# Patient Record
Sex: Male | Born: 1980 | Race: Black or African American | Hispanic: No | Marital: Married | Smoking: Never smoker
Health system: Southern US, Community
[De-identification: ages and names within clinical notes are randomized; demographics above are authoritative.]

---
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
## 2020-01-01 ENCOUNTER — Encounter (HOSPITAL_COMMUNITY): Payer: Self-pay | Admitting: *Deleted

## 2020-01-01 ENCOUNTER — Emergency Department (HOSPITAL_COMMUNITY)
Admission: EM | Admit: 2020-01-01 | Discharge: 2020-01-01 | Disposition: A | Discharge status: Home / Self Care | Payer: PRIVATE HEALTH INSURANCE | Attending: Emergency Medicine | Admitting: Emergency Medicine

## 2020-01-01 ENCOUNTER — Other Ambulatory Visit: Payer: Self-pay

## 2020-01-01 ENCOUNTER — Emergency Department (HOSPITAL_COMMUNITY): Payer: PRIVATE HEALTH INSURANCE

## 2020-01-01 DIAGNOSIS — Y939 Activity, unspecified: Secondary | ICD-10-CM | POA: Insufficient documentation

## 2020-01-01 DIAGNOSIS — Y929 Unspecified place or not applicable: Secondary | ICD-10-CM | POA: Diagnosis not present

## 2020-01-01 DIAGNOSIS — S29011A Strain of muscle and tendon of front wall of thorax, initial encounter: Secondary | ICD-10-CM | POA: Diagnosis not present

## 2020-01-01 DIAGNOSIS — Y990 Civilian activity done for income or pay: Secondary | ICD-10-CM | POA: Insufficient documentation

## 2020-01-01 DIAGNOSIS — X501XXA Overexertion from prolonged static or awkward postures, initial encounter: Secondary | ICD-10-CM | POA: Insufficient documentation

## 2020-01-01 DIAGNOSIS — S299XXA Unspecified injury of thorax, initial encounter: Secondary | ICD-10-CM | POA: Diagnosis present

## 2020-01-01 DIAGNOSIS — Y999 Unspecified external cause status: Secondary | ICD-10-CM | POA: Insufficient documentation

## 2020-01-01 DIAGNOSIS — R030 Elevated blood-pressure reading, without diagnosis of hypertension: Secondary | ICD-10-CM | POA: Diagnosis not present

## 2020-01-01 NOTE — ED Notes (Signed)
Patient transported to X-ray 

## 2020-01-01 NOTE — ED Provider Notes (Signed)
[facility]-EMERGENCY DEPT Provider Note   CSN: [id number] Arrival date & time: 01/01/20  0017   History Chief Complaint  Patient presents with  . Rib Pain    Travis Jacobson is a 39 y.o. male.  The history is provided by the patient.  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  Pain does get worse with certain movements, but not consistently.  He rates pain at 4/10.  He denies any difficulty breathing.  Pain is not worse with a deep breath.  He denies any other injury.  History reviewed. No pertinent past medical history.  There are no problems to display for this patient.   History reviewed. No pertinent surgical history.     No family history on file.  Social History   Tobacco Use  . Smoking status: Not on file  Substance Use Topics  . Alcohol use: Not on file  . Drug use: Not on file    Home Medications Prior to Admission medications   Medication Sig Start Date End Date Taking? Authorizing Provider  naproxen sodium (ALEVE) 220 MG tablet Take 220 mg by mouth 2 (two) times daily as needed (pain).   Yes [provider]    Allergies    Patient has no known allergies.  Review of Systems   Review of Systems  All other systems reviewed and are negative.   Physical Exam Updated Vital Signs BP (!) 174/99 (BP Location: Left Arm)   Pulse 89   Temp 98.4 F (36.9 C) (Oral)   Resp 18   Ht 5\' 9"  (1.753 m)   Wt 135.2 kg   SpO2 98%   BMI 44.01 kg/m   Physical Exam Vitals and nursing note reviewed.   39 year old male, resting comfortably and in no acute distress. Vital signs are significant for elevated blood pressure. Oxygen saturation is 98%, which is normal. Head is normocephalic and atraumatic. PERRLA, EOMI. Oropharynx is clear. Neck is nontender and supple without adenopathy or JVD. Back is moderately tender in the right  mid and lower rib cage.  There is no crepitus.  There is no CVA tenderness. Lungs are clear without rales, wheezes, or rhonchi. Chest is nontender. Heart has regular rate and rhythm without murmur. Abdomen is soft, flat, nontender without masses or hepatosplenomegaly and peristalsis is normoactive. Extremities have no cyanosis or edema, full range of motion is present. Skin is warm and dry without rash. Neurologic: Mental status is normal, cranial nerves are intact, there are no motor or sensory deficits.  ED Results / Procedures / Treatments    Radiology DG Ribs Unilateral W/Chest Right  Result Date: 01/01/2020 CLINICAL DATA:  Right rib pain EXAM: RIGHT RIBS AND CHEST - 3+ VIEW COMPARISON:  None. FINDINGS: No fracture or other bone lesions are seen involving the ribs. There is no evidence of pneumothorax or pleural effusion. Both lungs are clear. Heart size and mediastinal contours are within normal limits. IMPRESSION: Negative. Electronically Signed   By: 01/03/2020 MD   On: 01/01/2020 00:59    Procedures Procedures  Medications Ordered in ED Medications - No data to display  ED Course  I have reviewed the triage vital signs and the nursing notes.  Pertinent imaging results that were available during my care of the patient were reviewed by me and considered in my medical decision making (see  chart for details).  MDM Rules/Calculators/A&P Probable muscle strain right posterior rib cage.  Elevated blood pressure.  X-rays show no evidence of fracture.  Will recheck blood pressure.  Patient will need to have this followed as an outpatient.  Advised on applying ice, taking over-the-counter NSAIDs and acetaminophen as needed for pain.  Final Clinical Impression(s) / ED Diagnoses Final diagnoses:  Chest wall muscle strain, initial encounter  Elevated blood pressure reading without diagnosis of hypertension    Rx / DC Orders ED Discharge Orders    None       Dione Booze,  MD 01/01/20 [phone]

## 2020-01-01 NOTE — ED Triage Notes (Signed)
Pt states he was at work, reached up over his head with his right and he heard/felt a pop in his right side. "shooting pain". Pain to deep inspiration. Pain over the ribs to palpation. HTN in triage, denies no hx of the same.

## 2020-01-01 NOTE — Discharge Instructions (Addendum)
Apply ice for 20-30 minutes at a time, 3-4 times a day.  Take naproxen 1-2 tablets at a time, twice a day.  If you need additional pain relief, you may take acetaminophen.  Your blood pressure was elevated today.  Please have it rechecked several times over the next 1-2 weeks.  If it stays elevated, you will need to be on medication to control it.  Inadequately treated high blood pressure can lead to heart attacks, strokes, kidney failure.

## 2021-02-11 IMAGING — CR DG RIBS W/ CHEST 3+V*R*
4 series · 4 of 4 positions shown · non-contrast
Comparison: None.

CLINICAL DATA: Right rib pain

EXAM:
RIGHT RIBS AND CHEST - 3+ VIEW

[w chest pa]
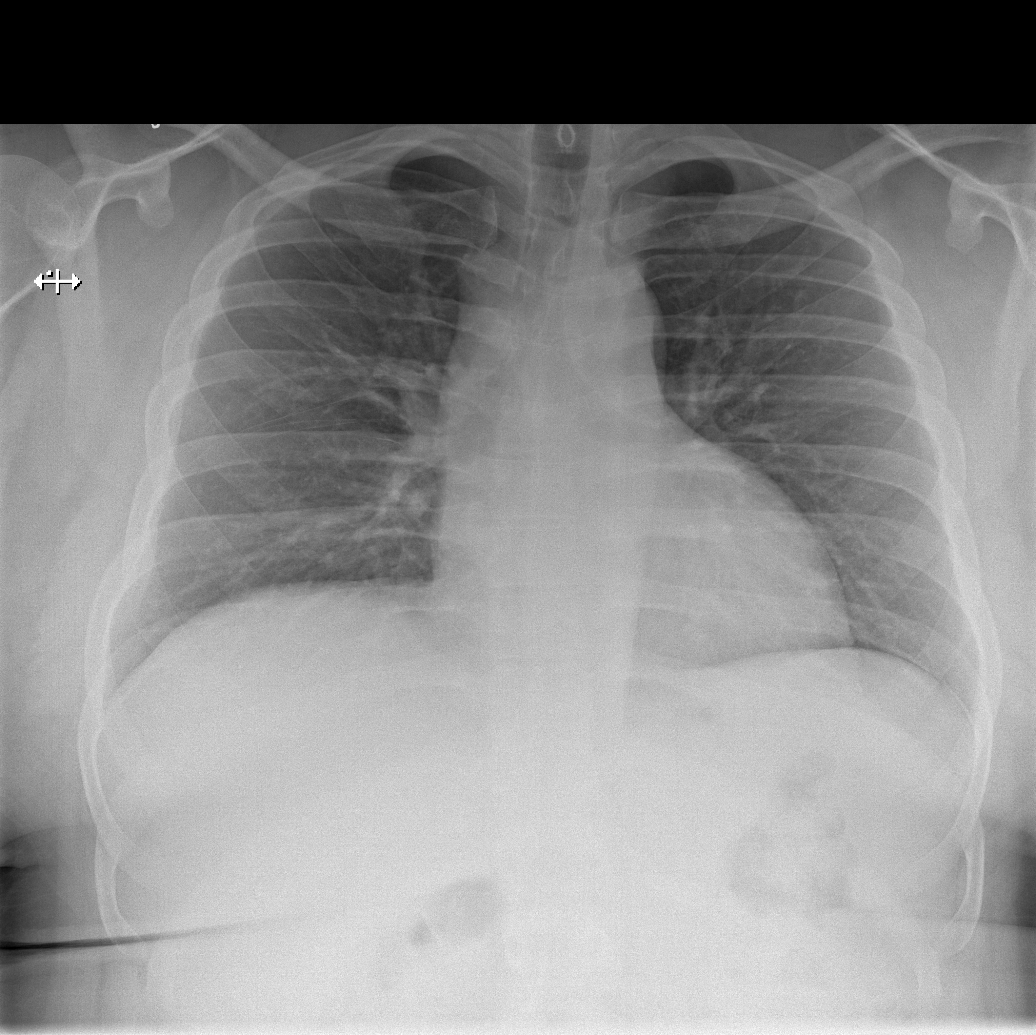

[w ribs obl right (1 of 3)]
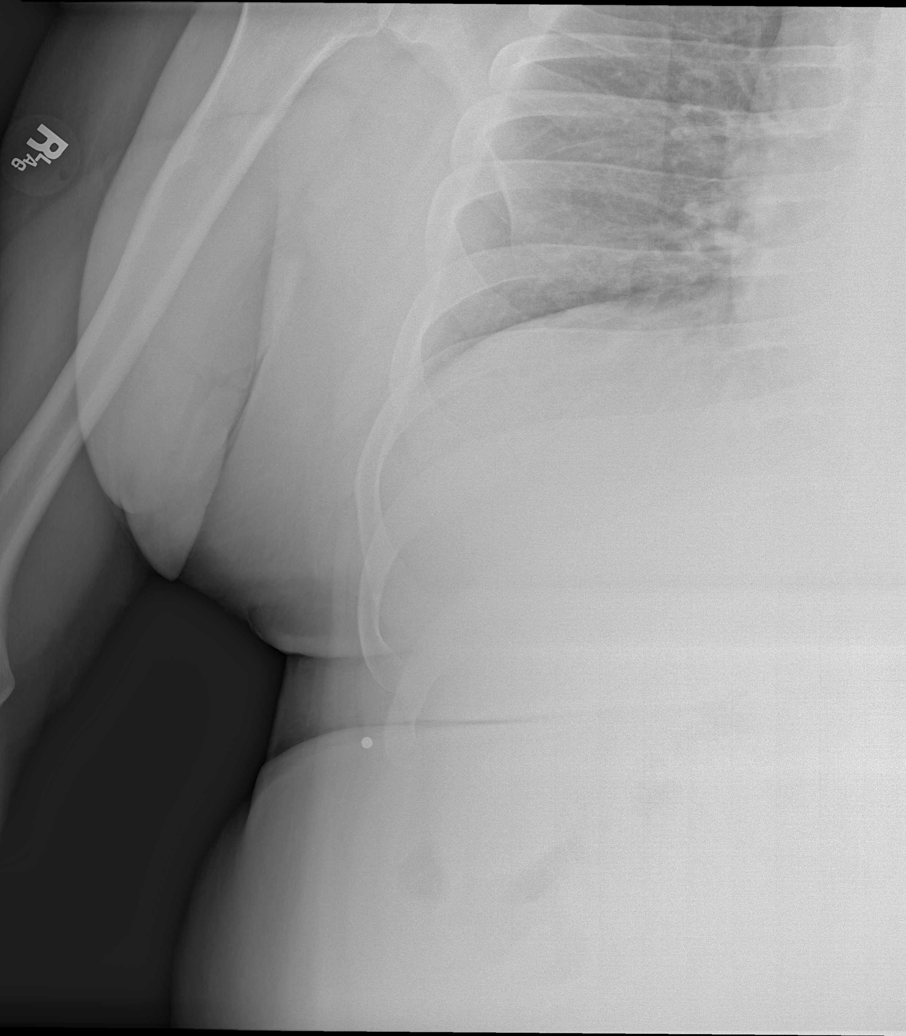

[w ribs obl right (2 of 3)]
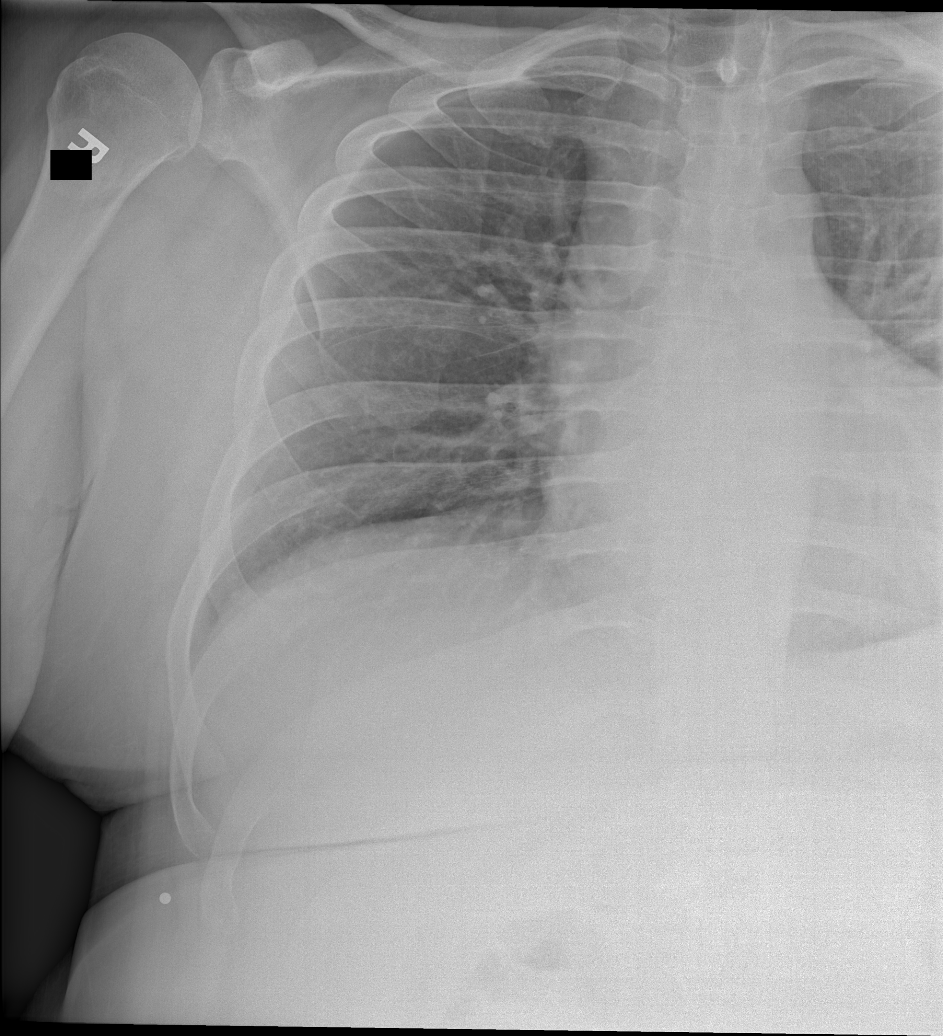

[w ribs obl right (3 of 3)]
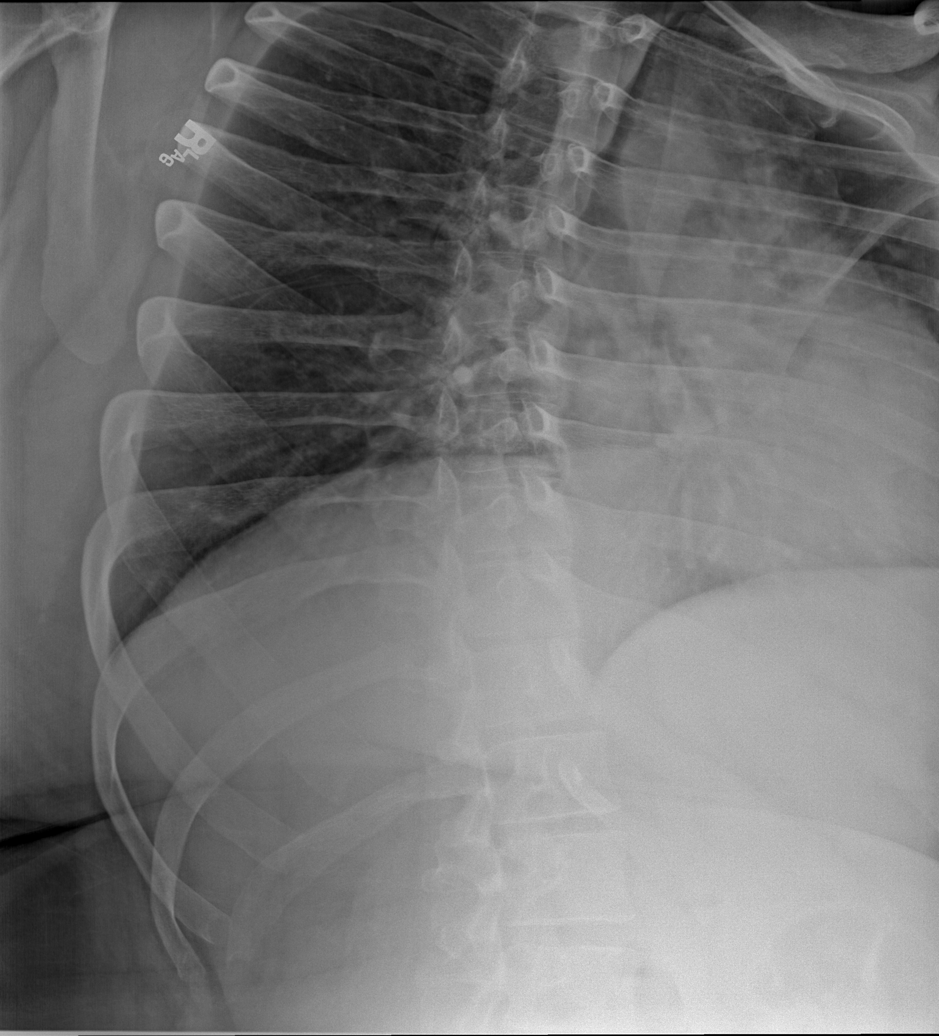

[4 of 4 positions shown; findings below may reference images not displayed]

FINDINGS: No fracture or other bone lesions are seen involving the ribs. There
is no evidence of pneumothorax or pleural effusion. Both lungs are
clear. Heart size and mediastinal contours are within normal limits.
IMPRESSION: Negative.

## 2022-10-13 ENCOUNTER — Other Ambulatory Visit: Payer: Self-pay

## 2022-10-13 ENCOUNTER — Inpatient Hospital Stay (HOSPITAL_COMMUNITY)
Admission: EM | Admit: 2022-10-13 | Discharge: 2022-10-16 | DRG: 871 | Disposition: A | Discharge status: Home / Self Care | Payer: 59 | Attending: Family Medicine | Admitting: Family Medicine

## 2022-10-13 ENCOUNTER — Emergency Department (HOSPITAL_COMMUNITY): Payer: 59

## 2022-10-13 ENCOUNTER — Ambulatory Visit
Admission: EM | Admit: 2022-10-13 | Discharge: 2022-10-13 | Disposition: A | Discharge status: Home / Self Care | Payer: 59

## 2022-10-13 DIAGNOSIS — J189 Pneumonia, unspecified organism: Secondary | ICD-10-CM | POA: Diagnosis present

## 2022-10-13 DIAGNOSIS — A419 Sepsis, unspecified organism: Secondary | ICD-10-CM | POA: Diagnosis present

## 2022-10-13 DIAGNOSIS — Z6841 Body Mass Index (BMI) 40.0 and over, adult: Secondary | ICD-10-CM | POA: Diagnosis not present

## 2022-10-13 DIAGNOSIS — E871 Hypo-osmolality and hyponatremia: Secondary | ICD-10-CM | POA: Diagnosis present

## 2022-10-13 DIAGNOSIS — N183 Chronic kidney disease, stage 3 unspecified: Secondary | ICD-10-CM | POA: Diagnosis present

## 2022-10-13 DIAGNOSIS — Z1152 Encounter for screening for COVID-19: Secondary | ICD-10-CM

## 2022-10-13 DIAGNOSIS — E876 Hypokalemia: Secondary | ICD-10-CM | POA: Diagnosis present

## 2022-10-13 DIAGNOSIS — I2489 Other forms of acute ischemic heart disease: Secondary | ICD-10-CM | POA: Diagnosis present

## 2022-10-13 DIAGNOSIS — J9601 Acute respiratory failure with hypoxia: Secondary | ICD-10-CM | POA: Diagnosis present

## 2022-10-13 DIAGNOSIS — I5032 Chronic diastolic (congestive) heart failure: Secondary | ICD-10-CM | POA: Diagnosis present

## 2022-10-13 DIAGNOSIS — R0609 Other forms of dyspnea: Secondary | ICD-10-CM | POA: Diagnosis not present

## 2022-10-13 DIAGNOSIS — N179 Acute kidney failure, unspecified: Secondary | ICD-10-CM | POA: Diagnosis present

## 2022-10-13 DIAGNOSIS — J9600 Acute respiratory failure, unspecified whether with hypoxia or hypercapnia: Secondary | ICD-10-CM | POA: Diagnosis present

## 2022-10-13 DIAGNOSIS — R0602 Shortness of breath: Secondary | ICD-10-CM | POA: Diagnosis not present

## 2022-10-13 HISTORY — DX: Sepsis, unspecified organism: J18.9

## 2022-10-13 HISTORY — DX: Pneumonia, unspecified organism: A41.9

## 2022-10-13 HISTORY — DX: Acute respiratory failure, unspecified whether with hypoxia or hypercapnia: J96.00

## 2022-10-13 LAB — CBC WITH DIFFERENTIAL/PLATELET
Abs Immature Granulocytes: 0.99 10*3/uL — ABNORMAL HIGH (ref 0.00–0.07)
Basophils Absolute: 0.1 10*3/uL (ref 0.0–0.1)
Basophils Relative: 0 %
Eosinophils Absolute: 0.1 10*3/uL (ref 0.0–0.5)
Eosinophils Relative: 0 %
HCT: 40 % (ref 39.0–52.0)
Hemoglobin: 12.5 g/dL — ABNORMAL LOW (ref 13.0–17.0)
Immature Granulocytes: 4 %
Lymphocytes Relative: 6 %
Lymphs Abs: 1.6 10*3/uL (ref 0.7–4.0)
MCH: 24.5 pg — ABNORMAL LOW (ref 26.0–34.0)
MCHC: 31.3 g/dL (ref 30.0–36.0)
MCV: 78.3 fL — ABNORMAL LOW (ref 80.0–100.0)
Monocytes Absolute: 1.8 10*3/uL — ABNORMAL HIGH (ref 0.1–1.0)
Monocytes Relative: 7 %
Neutro Abs: 21.8 10*3/uL — ABNORMAL HIGH (ref 1.7–7.7)
Neutrophils Relative %: 83 %
Platelets: 305 10*3/uL (ref 150–400)
RBC: 5.11 MIL/uL (ref 4.22–5.81)
RDW: 13.3 % (ref 11.5–15.5)
WBC: 26.3 10*3/uL — ABNORMAL HIGH (ref 4.0–10.5)
nRBC: 0 % (ref 0.0–0.2)

## 2022-10-13 LAB — COMPREHENSIVE METABOLIC PANEL
ALT: 15 U/L (ref 0–44)
AST: 15 U/L (ref 15–41)
Albumin: 3.2 g/dL — ABNORMAL LOW (ref 3.5–5.0)
Alkaline Phosphatase: 45 U/L (ref 38–126)
Anion gap: 12 (ref 5–15)
BUN: 17 mg/dL (ref 6–20)
CO2: 27 mmol/L (ref 22–32)
Calcium: 8.1 mg/dL — ABNORMAL LOW (ref 8.9–10.3)
Chloride: 91 mmol/L — ABNORMAL LOW (ref 98–111)
Creatinine, Ser: 1.63 mg/dL — ABNORMAL HIGH (ref 0.61–1.24)
GFR, Estimated: 54 mL/min — ABNORMAL LOW (ref >60)
Glucose, Bld: 120 mg/dL — ABNORMAL HIGH (ref 70–99)
Potassium: 3 mmol/L — ABNORMAL LOW (ref 3.5–5.1)
Sodium: 130 mmol/L — ABNORMAL LOW (ref 135–145)
Total Bilirubin: 0.9 mg/dL (ref 0.3–1.2)
Total Protein: 7.7 g/dL (ref 6.5–8.1)

## 2022-10-13 LAB — TROPONIN I (HIGH SENSITIVITY)
Troponin I (High Sensitivity): 100 ng/L (ref <18)
Troponin I (High Sensitivity): 85 ng/L — ABNORMAL HIGH (ref <18)

## 2022-10-13 LAB — RESP PANEL BY RT-PCR (RSV, FLU A&B, COVID)  RVPGX2
Influenza A by PCR: NEGATIVE
Influenza B by PCR: NEGATIVE
Resp Syncytial Virus by PCR: NEGATIVE
SARS Coronavirus 2 by RT PCR: NEGATIVE

## 2022-10-13 LAB — LACTIC ACID, PLASMA
Lactic Acid, Venous: 1.2 mmol/L (ref 0.5–1.9)
Lactic Acid, Venous: 1.7 mmol/L (ref 0.5–1.9)

## 2022-10-13 LAB — BRAIN NATRIURETIC PEPTIDE: B Natriuretic Peptide: 271.5 pg/mL — ABNORMAL HIGH (ref 0.0–100.0)

## 2022-10-13 MED ORDER — IOHEXOL 350 MG/ML SOLN
75.0000 mL | Freq: Once | INTRAVENOUS | Status: AC | PRN
  Administered 2022-10-13: 75 mL via INTRAVENOUS

## 2022-10-13 MED ORDER — SODIUM CHLORIDE 0.9 % IV SOLN
1.0000 g | Freq: Once | INTRAVENOUS | Status: AC
  Administered 2022-10-13: 1 g via INTRAVENOUS
  Filled 2022-10-13: qty 10

## 2022-10-13 MED ORDER — POTASSIUM CHLORIDE CRYS ER 20 MEQ PO TBCR
40.0000 meq | EXTENDED_RELEASE_TABLET | Freq: Once | ORAL | Status: AC
  Administered 2022-10-13: 40 meq via ORAL
  Filled 2022-10-13: qty 2

## 2022-10-13 MED ORDER — SODIUM CHLORIDE 0.9 % IV SOLN: INTRAVENOUS | Status: DC

## 2022-10-13 MED ORDER — SODIUM CHLORIDE 0.9 % IV SOLN
500.0000 mg | Freq: Once | INTRAVENOUS | Status: AC
  Administered 2022-10-13: 500 mg via INTRAVENOUS
  Filled 2022-10-13: qty 5

## 2022-10-13 MED ORDER — GUAIFENESIN ER 600 MG PO TB12
600.0000 mg | ORAL_TABLET | Freq: Two times a day (BID) | ORAL | Status: DC
  Administered 2022-10-14 – 2022-10-15 (×3): 600 mg via ORAL
  Filled 2022-10-13 (×3): qty 1

## 2022-10-13 MED ORDER — LACTATED RINGERS IV BOLUS
1000.0000 mL | Freq: Once | INTRAVENOUS | Status: AC
  Administered 2022-10-13: 1000 mL via INTRAVENOUS

## 2022-10-13 MED ORDER — IPRATROPIUM-ALBUTEROL 0.5-2.5 (3) MG/3ML IN SOLN
3.0000 mL | Freq: Once | RESPIRATORY_TRACT | Status: AC
  Administered 2022-10-13: 3 mL via RESPIRATORY_TRACT
  Filled 2022-10-13: qty 3

## 2022-10-13 NOTE — ED Notes (Signed)
Pt do not want to go by EMS to the ED. Pt reports his family will drive him to the ED.

## 2022-10-13 NOTE — ED Provider Notes (Addendum)
[facility] EMERGENCY DEPARTMENT AT [facility] Provider Note   CSN: [id number] Arrival date & time: 10/13/22  1625     History  Chief Complaint  Patient presents with   Shortness of Breath   Otalgia    Travis Jacobson is a 42 y.o. male.  HPI Patient reports he has been sick for about a week.  He has had a cough.  He reports he has had a fever at one point up to 101.  No lower extremity swelling or pain.  No sick contacts at home.  Family members have tested negative for COVID.  Patient's wife reports she was diagnosed with a bronchitis and pneumonia as well.  Symptoms have been worsening and he has gotten increasingly short of breath especially with exertion.    Home Medications Prior to Admission medications   Medication Sig Start Date End Date Taking? Authorizing Provider  naproxen sodium (ALEVE) 220 MG tablet Take 220 mg by mouth 2 (two) times daily as needed (pain).    [provider]      Allergies    Patient has no known allergies.    Review of Systems   Review of Systems  Physical Exam Updated Vital Signs BP 135/86   Pulse (!) 103   Temp 97.9 F (36.6 C) (Oral)   Resp (!) 29   Ht 5\' 9"  (1.753 m)   Wt (!) 137 kg   SpO2 92%   BMI 44.60 kg/m  Physical Exam Constitutional:      Comments: Mild increased work of breathing at rest.  Mental status clear.  HENT:     Mouth/Throat:     Pharynx: Oropharynx is clear.  Eyes:     Extraocular Movements: Extraocular movements intact.  Cardiovascular:     Comments: Mild tachycardia. Pulmonary:     Comments: Mild increased work of breathing.  Crackles left lung base. Chest:     Chest wall: Tenderness present.  Abdominal:     Palpations: Abdomen is soft.  Musculoskeletal:        General: No swelling. Normal range of motion.     Right lower leg: No edema.     Left lower leg: No edema.  Skin:    General: Skin is warm and dry.  Neurological:     General: No focal deficit present.     Mental  Status: He is oriented to person, place, and time.     Motor: No weakness.     Coordination: Coordination normal.  Psychiatric:        Mood and Affect: Mood normal.     ED Results / Procedures / Treatments   Labs (all labs ordered are listed, but only abnormal results are displayed) Labs Reviewed  COMPREHENSIVE METABOLIC PANEL - Abnormal; Notable for the following components:      Result Value   Sodium 130 (*)    Potassium 3.0 (*)    Chloride 91 (*)    Glucose, Bld 120 (*)    Creatinine, Ser 1.63 (*)    Calcium 8.1 (*)    Albumin 3.2 (*)    GFR, Estimated 54 (*)    All other components within normal limits  BRAIN NATRIURETIC PEPTIDE - Abnormal; Notable for the following components:   B Natriuretic Peptide 271.5 (*)    All other components within normal limits  CBC WITH DIFFERENTIAL/PLATELET - Abnormal; Notable for the following components:   WBC 26.3 (*)    Hemoglobin 12.5 (*)    MCV 78.3 (*)  MCH 24.5 (*)    Neutro Abs 21.8 (*)    Monocytes Absolute 1.8 (*)    Abs Immature Granulocytes 0.99 (*)    All other components within normal limits  TROPONIN I (HIGH SENSITIVITY) - Abnormal; Notable for the following components:   Troponin I (High Sensitivity) 100 (*)    All other components within normal limits  RESP PANEL BY RT-PCR (RSV, FLU A&B, COVID)  RVPGX2  CULTURE, BLOOD (ROUTINE X 2)  CULTURE, BLOOD (ROUTINE X 2)  LACTIC ACID, PLASMA  LACTIC ACID, PLASMA  TROPONIN I (HIGH SENSITIVITY)    EKG EKG Interpretation  Date/Time:  Friday October 13 2022 16:35:17 EDT Ventricular Rate:  103 PR Interval:  132 QRS Duration: 87 QT Interval:  359 QTC Calculation: 470 R Axis:   43 Text Interpretation: Sinus tachycardia Probable LVH with secondary repol abnrm Anterior ST elevation, probably due to LVH agree, no old comparison Confirmed by Arby Barrette [id number]) on 10/13/2022 5:28:59 PM  Radiology CT Angio Chest PE W/Cm &/Or Wo Cm  Result Date: 10/13/2022 CLINICAL DATA:   Pulmonary embolism (PE) suspected, high prob Fever, shortness of breath, left-sided pain. EXAM: CT ANGIOGRAPHY CHEST WITH CONTRAST TECHNIQUE: Multidetector CT imaging of the chest was performed using the standard protocol during bolus administration of intravenous contrast. Multiplanar CT image reconstructions and MIPs were obtained to evaluate the vascular anatomy. RADIATION DOSE REDUCTION: This exam was performed according to the departmental dose-optimization program which includes automated exposure control, adjustment of the mA and/or kV according to patient size and/or use of iterative reconstruction technique. CONTRAST:  75mL OMNIPAQUE IOHEXOL 350 MG/ML SOLN COMPARISON:  Chest radiograph earlier today FINDINGS: Cardiovascular: There are no filling defects within the pulmonary arteries to suggest pulmonary embolus. Upper normal main pulmonary artery at 3.2 cm. The heart is mildly enlarged. No pericardial effusion. The thoracic aorta is normal in caliber without acute findings. Mediastinum/Nodes: Diffuse mediastinal and bilateral hilar adenopathy. Representative left lower paratracheal node measures 14 mm short axis, series 5, image 47. Right hilar lymph node measures 17 mm short axis, series 5, image 57. patulous esophagus without wall thickening. Lungs/Pleura: Extensive multifocal airspace disease. Confluent consolidation involving the lateral left upper lobe and lingula, periphery of the right middle lobe and both lower lobes. Additional areas of nodular airspace disease throughout the upper lobes. There are small bilateral pleural effusions. Upper Abdomen: Hepatic steatosis. No acute upper abdominal findings. Musculoskeletal: There are no acute or suspicious osseous abnormalities. Review of the MIP images confirms the above findings. IMPRESSION: 1. No pulmonary embolus. 2. Extensive multifocal airspace disease, consistent with bilateral pneumonia. Small bilateral pleural effusions. 3. Mediastinal and  bilateral hilar adenopathy is likely reactive, however consider follow-up CT after resolution of symptoms to resolution. 4. Mild cardiomegaly. Upper normal main pulmonary artery can be seen with pulmonary arterial hypertension. 5. Hepatic steatosis. Electronically Signed   By: Narda Rutherford M.D.   On: 10/13/2022 19:39   DG Chest 2 View  Result Date: 10/13/2022 CLINICAL DATA:  Shortness of breath and productive cough. EXAM: CHEST - 2 VIEW COMPARISON:  January 01, 2020 FINDINGS: The heart size and mediastinal contours are within normal limits. Low lung volumes are noted. Mild infiltrate is seen within the right lung base. Mild left basilar atelectasis is also suspected. There is no evidence of a pleural effusion or pneumothorax. The visualized skeletal structures are unremarkable. IMPRESSION: Low lung volumes with mild right basilar infiltrate and mild left basilar atelectasis. Electronically Signed   By: Aram Candela  M.D.   On: 10/13/2022 18:30    Procedures Procedures    Medications Ordered in ED Medications  cefTRIAXone (ROCEPHIN) 1 g in sodium chloride 0.9 % 100 mL IVPB (1 g Intravenous New Bag/Given 10/13/22 2031)  azithromycin (ZITHROMAX) 500 mg in sodium chloride 0.9 % 250 mL IVPB (has no administration in time range)  ipratropium-albuterol (DUONEB) 0.5-2.5 (3) MG/3ML nebulizer solution 3 mL (3 mLs Nebulization Given 10/13/22 1758)  lactated ringers bolus 1,000 mL (1,000 mLs Intravenous New Bag/Given 10/13/22 1944)  potassium chloride SA (KLOR-CON M) CR tablet 40 mEq (40 mEq Oral Given 10/13/22 1941)  iohexol (OMNIPAQUE) 350 MG/ML injection 75 mL (75 mLs Intravenous Contrast Given 10/13/22 1911)    ED Course/ Medical Decision Making/ A&P                             Medical Decision Making Amount and/or Complexity of Data Reviewed Labs: ordered. Radiology: ordered.  Risk Prescription drug management. Decision regarding hospitalization.   Presents as outlined.  He had increasing  cough and fever.  Patient was hypoxic at urgent care 84 to 86%.  He was tachycardic.  Patient was referred to the emergency department further diagnostic evaluation.  At this time differential diagnosis includes pneumonia\PE\bronchitis\CHF\ACS.  Proceed with diagnostic evaluation.  Patient is stable.  Mental status is clear.  He is not showing signs of respiratory collapse.  Will initiate DuoNeb therapy and fluids.  White count 26,000 H&H 12.5 and 40.  Sodium 130, potassium 3.0 BUN/creatinine 17 and 8.1.  Lactic acid 1.7.  First troponin 100, second troponin 85  With elevated troponin, tachycardia and hypoxia will proceed with CT PE study.  ED PE study interpreted by radiology negative for PE but positive for bilateral pneumonia.  At this point with the patient having hypoxia and bilateral pneumonia will start blood cultures and antibiotics.  At rest patient's oxygen can go to mid to upper 90s.  With minimal activity oxygen saturation drops down to at least 90.  I do not have the patient formally ambulate but anytime he gets up moves around, sits back down he is in the low 90s.  With significant leukocytosis, hypoxia and bilateral pneumonia will plan for admission.  Consult: [facility] hospitalist Dr. Pola Corn for admission.        Final Clinical Impression(s) / ED Diagnoses Final diagnoses:  Pneumonia of both lungs due to infectious organism, unspecified part of lung    Rx / DC Orders ED Discharge Orders     None         Arby Barrette, MD 10/13/22 2102    Arby Barrette, MD 10/13/22 2121

## 2022-10-13 NOTE — ED Triage Notes (Addendum)
Pt arrived via POV. Referred from UC for low o2 sats. Pt satting between 92-96 on RA while in triage.C/o productive cough, SOB, and R ear pain for 1x week

## 2022-10-13 NOTE — Discharge Instructions (Addendum)
Please go to the emergency room as you are in need of a higher level of care than we can provide in the urgent care setting. You are in respiratory failure with a pulse oximetry ranging from 84%-86% on room air. This requires more testing and intervention including pulmonary imaging, respiratory support, blood tests to rule out severe pneumonia, sepsis, pulmonary embolism. You have declined transport by ambulance and therefore, please have your wife take you there now.

## 2022-10-13 NOTE — H&P (Addendum)
[facility] History and Physical  Tobias Mellette NWG:[id number] DOB: [birth year] DOA: 10/13/2022 PCP: Pcp, No  Admitted from: Home Chief Complaint: Cough, dyspnea, chest pain for a week  History of Present Illness: Travis Jacobson is a 42 y.o. male PMH significant for morbid obesity Patient presented to the an urgent care today with complaint of worsening persistent cough, left-sided chest pain, right ear pain, shortness of breath, fever of 101, malaise and fatigue for 1 week.  He has multiple other family members sick with similar illness and all of them tested negative for COVID at home.  His O2 sat was low at 86% and was sent to [facility] ED.  In the ED, patient was afebrile, heart rate 103, blood pressure 153/95 Labs showed WC count elevated to 26.3, hemoglobin 12.5, lactic acid level normal,  Sodium low at 130, potassium low at 3, BUN/creatinine elevated to 17/1.63 BNP elevated to 271, troponin elevated to 100>>85 Respiratory panel negative Chest x-ray showed low lung volumes with mild right basilar infiltrate and mild left basilar atelectasis. CT angio chest did not show any evidence of pulm embolism but showed bilateral extensive multifocal infiltrates, small bilateral pleural effusion, reactive hilar and mediastinal adenopathy, mild cardiomegaly, possible pulmonary hypertension and hepatic steatosis. Blood culture collected Patient was given IV Rocephin and IV azithromycin Hospitalist service consulted for inpatient admission and management  At the time of my evaluation, patient was lying on bed.  Tachypneic to low 90s on room air at rest.  States he was significant short of breath on walking to the bathroom. No family at bedside History reviewed and detailed as above.  Also believes he has sleep apnea but has not been tested.  Reports snoring, frequent nighttime awakening.  Denies history of CHF, diabetes.  Review of Systems:  All systems were reviewed and were negative unless otherwise  mentioned in the HPI   Past medical history: Morbid obesity  Past surgical history: No past surgical history on file.  Social History:  reports that he has never smoked. He has never used smokeless tobacco. He reports that he does not currently use alcohol. He reports that he does not use drugs.  Allergies:  No Known Allergies Patient has no known allergies.   Family history:  No family history on file.  Family history reviewed.  Not pertinent to current presentation  Home Meds: Prior to Admission medications   Medication Sig Start Date End Date Taking? Authorizing Provider  naproxen sodium (ALEVE) 220 MG tablet Take 220 mg by mouth 2 (two) times daily as needed (pain).    [provider]    Physical Exam: Vitals:   10/13/22 1631 10/13/22 1653 10/13/22 1700 10/13/22 2000  BP: (!) 153/95  (!) 145/90 135/86  Pulse: (!) 103  99 (!) 103  Resp: 18  17 (!) 29  Temp:  97.9 F (36.6 C)    TempSrc:  Oral    SpO2: 95%  94% 92%  Weight:  (!) 137 kg    Height:  5\' 9"  (1.753 m)     Wt Readings from Last 3 Encounters:  10/13/22 (!) 137 kg  01/01/20 135.2 kg   Body mass index is 44.6 kg/m.  General exam: Pleasant, young morbidly obese African-American male.  Mild respiratory distress. Skin: No rashes, lesions or ulcers. HEENT: Atraumatic, normocephalic, no obvious bleeding Lungs: Diminished air entry in both bases.  Coughs on deep breathing.  pleuritic chest pain on both sides on deep breathing CVS: Mild tachycardia, no murmur GI/Abd soft  nondistended obesity, nontender, bowel sound present CNS: somnolent, opens eyes on verbal command.  Looks tired.  Oriented x 3 Psychiatry: Tired.  Mood appropriate Extremities: No pedal edema, no calf tenderness   ------------------------------------------------------------------------------------------------------ Assessment/Plan: Principal Problem:   Acute respiratory failure (HCC) Active Problems:   Sepsis due to pneumonia  (HCC)   Obesity, Class III, BMI 40-49.9 (morbid obesity) (HCC)  Sepsis POA  Bilateral multifocal pneumonia Acute respiratory with hypoxia Presented with 1 week history of worsening respiratory symptoms with cough, shortness of breath, left-sided chest pain, fatigue At the urgent care center, he was noted to be hypoxic in 80s on room air Reports fever of 101 at home. WBC count significant elevated to 26,000 CT chest as above showed bilateral multifocal infiltrates Respiratory panel negative Blood culture collected Started on IV Rocephin and IV azithromycin.  Continue the same Add procalcitonin level Send sputum culture Order for bronchodilators as needed, Mucinex, incentive spirometry, flutter valve Encourage ambulation.  Check ambulatory oxygen requirement Recent Labs  Lab 10/13/22 1753 10/13/22 1757 10/13/22 1950  WBC  --  26.3*  --   LATICACIDVEN 1.2  --  1.7   Elevated BNP Elevated troponin No prior history of CHF, looks overall dry but has elevated BNP  Elevated troponin -possibly due to sepsis EKG without ischemic changes. Obtain echocardiogram given CT chest evidence of cardiomegaly, possible pulmonary artery hypertension and effusion.  May have underlying OSA with RV changes. Recent Labs    10/13/22 1757  BNP 271.5*   Elevated creatinine Present with creatinine of 1.63.  No baseline available.  Likely AKI related to sepsis.  Takes naproxen as needed but very occasionally. Monitor on hydration overnight. Recent Labs    10/13/22 1757  BUN 17  CREATININE 1.63*   Hyponatremia Probably due to poor oral intake.  Need to rule out CHF as well.  Continue to monitor Recent Labs  Lab 10/13/22 1757  NA 130*   Hypokalemia Potassium level low at 3.  Replacement ordered.  Obtain magnesium level as well Recent Labs  Lab 10/13/22 1757  K 3.0*   Morbid Obesity  Body mass index is 44.6 kg/m. Patient has been advised to make an attempt to improve diet and exercise  patterns to aid in weight loss.  Possible OSA Reports snoring and multiple apneic awakening at night.  Outpatient study suggested  Mobility: Encourage ambulation  Goals of care   Code Status: Full Code    DVT prophylaxis: Lovenox subcu    Antimicrobials: IV Rocephin, azithromycin Fluid: NS at 100 mill per hour Consultants: None Family Communication: None at bedside  Dispo: The patient is from: Home              Anticipated d/c is to: Home likely in 2 to 3 days  Diet: Diet Order     None       ------------------------------------------------------------------------------------- Severity of Illness: The appropriate patient status for this patient is INPATIENT. Inpatient status is judged to be reasonable and necessary in order to provide the required intensity of service to ensure the patient's safety. The patient's presenting symptoms, physical exam findings, and initial radiographic and laboratory data in the context of their chronic comorbidities is felt to place them at high risk for further clinical deterioration. Furthermore, it is not anticipated that the patient will be medically stable for discharge from the hospital within 2 midnights of admission.   * I certify that at the point of admission it is my clinical judgment that the patient will  require inpatient hospital care spanning beyond 2 midnights from the point of admission due to high intensity of service, high risk for further deterioration and high frequency of surveillance required.* -------------------------------------------------------------------------------------   Labs on Admission:   CBC: Recent Labs  Lab 10/13/22 1757  WBC 26.3*  NEUTROABS 21.8*  HGB 12.5*  HCT 40.0  MCV 78.3*  PLT 305    Basic Metabolic Panel: Recent Labs  Lab 10/13/22 1757  NA 130*  K 3.0*  CL 91*  CO2 27  GLUCOSE 120*  BUN 17  CREATININE 1.63*  CALCIUM 8.1*    Liver Function Tests: Recent Labs  Lab  10/13/22 1757  AST 15  ALT 15  ALKPHOS 45  BILITOT 0.9  PROT 7.7  ALBUMIN 3.2*   No results for input(s): "LIPASE", "AMYLASE" in the last 168 hours. No results for input(s): "AMMONIA" in the last 168 hours.  Cardiac Enzymes: No results for input(s): "CKTOTAL", "CKMB", "CKMBINDEX", "TROPONINI" in the last 168 hours.  BNP (last 3 results) Recent Labs    10/13/22 1757  BNP 271.5*    ProBNP (last 3 results) No results for input(s): "PROBNP" in the last 8760 hours.  CBG: No results for input(s): "GLUCAP" in the last 168 hours.  Lipase  No results found for: "LIPASE"   Urinalysis No results found for: "COLORURINE", "APPEARANCEUR", "LABSPEC", "PHURINE", "GLUCOSEU", "HGBUR", "BILIRUBINUR", "KETONESUR", "PROTEINUR", "UROBILINOGEN", "NITRITE", "LEUKOCYTESUR"   Drugs of Abuse  No results found for: "LABOPIA", "COCAINSCRNUR", "LABBENZ", "AMPHETMU", "THCU", "LABBARB"    Radiological Exams on Admission: CT Angio Chest PE W/Cm &/Or Wo Cm  Result Date: 10/13/2022 CLINICAL DATA:  Pulmonary embolism (PE) suspected, high prob Fever, shortness of breath, left-sided pain. EXAM: CT ANGIOGRAPHY CHEST WITH CONTRAST TECHNIQUE: Multidetector CT imaging of the chest was performed using the standard protocol during bolus administration of intravenous contrast. Multiplanar CT image reconstructions and MIPs were obtained to evaluate the vascular anatomy. RADIATION DOSE REDUCTION: This exam was performed according to the departmental dose-optimization program which includes automated exposure control, adjustment of the mA and/or kV according to patient size and/or use of iterative reconstruction technique. CONTRAST:  75mL OMNIPAQUE IOHEXOL 350 MG/ML SOLN COMPARISON:  Chest radiograph earlier today FINDINGS: Cardiovascular: There are no filling defects within the pulmonary arteries to suggest pulmonary embolus. Upper normal main pulmonary artery at 3.2 cm. The heart is mildly enlarged. No pericardial  effusion. The thoracic aorta is normal in caliber without acute findings. Mediastinum/Nodes: Diffuse mediastinal and bilateral hilar adenopathy. Representative left lower paratracheal node measures 14 mm short axis, series 5, image 47. Right hilar lymph node measures 17 mm short axis, series 5, image 57. patulous esophagus without wall thickening. Lungs/Pleura: Extensive multifocal airspace disease. Confluent consolidation involving the lateral left upper lobe and lingula, periphery of the right middle lobe and both lower lobes. Additional areas of nodular airspace disease throughout the upper lobes. There are small bilateral pleural effusions. Upper Abdomen: Hepatic steatosis. No acute upper abdominal findings. Musculoskeletal: There are no acute or suspicious osseous abnormalities. Review of the MIP images confirms the above findings. IMPRESSION: 1. No pulmonary embolus. 2. Extensive multifocal airspace disease, consistent with bilateral pneumonia. Small bilateral pleural effusions. 3. Mediastinal and bilateral hilar adenopathy is likely reactive, however consider follow-up CT after resolution of symptoms to resolution. 4. Mild cardiomegaly. Upper normal main pulmonary artery can be seen with pulmonary arterial hypertension. 5. Hepatic steatosis. Electronically Signed   By: Narda Rutherford M.D.   On: 10/13/2022 19:39   DG Chest 2 View  Result Date: 10/13/2022 CLINICAL DATA:  Shortness of breath and productive cough. EXAM: CHEST - 2 VIEW COMPARISON:  January 01, 2020 FINDINGS: The heart size and mediastinal contours are within normal limits. Low lung volumes are noted. Mild infiltrate is seen within the right lung base. Mild left basilar atelectasis is also suspected. There is no evidence of a pleural effusion or pneumothorax. The visualized skeletal structures are unremarkable. IMPRESSION: Low lung volumes with mild right basilar infiltrate and mild left basilar atelectasis. Electronically Signed   By: Aram Candela M.D.   On: 10/13/2022 18:30     Signed, Lorin Glass, MD [facility] 10/13/2022

## 2022-10-13 NOTE — ED Provider Notes (Signed)
[facility] - URGENT [facility]  Note:  This document was prepared using Conservation officer, historic buildings and may include unintentional dictation errors.  MRN: [id number] DOB: [birth year]  Subjective:   Travis Jacobson is a 42 y.o. male presenting for 1 week history of persistent and worsening coughing, left-sided chest pain, right ear pain, shortness of breath, fever, malaise and fatigue.  Has had multiple sick contacts at home.  His son was seen through our clinic and tested negative for COVID.  The patient and his wife have also done COVID test at home and were negative.  No recent travel.  No respiratory disorders.  No smoking of any kind.  No history of pulmonary embolism.  No history of cardiac conditions.  No current facility-administered medications for this encounter.  Current Outpatient Medications:    naproxen sodium (ALEVE) 220 MG tablet, Take 220 mg by mouth 2 (two) times daily as needed (pain)., Disp: , Rfl:    No Known Allergies  History reviewed. No pertinent past medical history.   History reviewed. No pertinent surgical history.  History reviewed. No pertinent family history.  Social History   Tobacco Use   Smoking status: Never   Smokeless tobacco: Never  Vaping Use   Vaping Use: Never used  Substance Use Topics   Alcohol use: Not Currently   Drug use: Never    ROS   Objective:   Vitals: BP 137/83 (BP Location: Right Arm)   Pulse (!) 111   Temp 98.7 F (37.1 C) (Oral)   Resp (!) 25   SpO2 (!) 86%   Patient was hypoxic throughout triage, ranged between 84%-86%.  He was placed on 4 L of oxygen which improved his pulse oximetry to 93%. Could not tolerate more.   Physical Exam Constitutional:      General: He is not in acute distress.    Appearance: Normal appearance. He is well-developed and normal weight. He is diaphoretic. He is not ill-appearing or toxic-appearing.  HENT:     Head: Normocephalic and atraumatic.     Right Ear: Ear canal and  external ear normal. There is no impacted cerumen. Tympanic membrane is erythematous. Tympanic membrane is not bulging.     Left Ear: Tympanic membrane, ear canal and external ear normal. There is no impacted cerumen. Tympanic membrane is not erythematous or bulging.     Nose: Congestion and rhinorrhea present.     Mouth/Throat:     Mouth: Mucous membranes are moist.     Pharynx: No oropharyngeal exudate or posterior oropharyngeal erythema.  Eyes:     General: No scleral icterus.       Right eye: No discharge.        Left eye: No discharge.     Extraocular Movements: Extraocular movements intact.  Cardiovascular:     Rate and Rhythm: Regular rhythm. Tachycardia present.     Heart sounds: Normal heart sounds. No murmur heard.    No friction rub. No gallop.  Pulmonary:     Effort: Pulmonary effort is normal. No respiratory distress.     Breath sounds: Normal breath sounds. No stridor. No wheezing, rhonchi or rales.  Musculoskeletal:     Cervical back: Normal range of motion.  Neurological:     Mental Status: He is alert and oriented to person, place, and time.  Psychiatric:        Mood and Affect: Mood normal.        Behavior: Behavior normal.  Thought Content: Thought content normal.        Judgment: Judgment normal.     Assessment and Plan :   PDMP not reviewed this encounter.  1. Acute respiratory failure with hypoxia (HCC)    Patient is in acute respiratory failure with hypoxia likely secondary to an infectious process.  He is not hemodynamically stable as he is tachycardic as well and short of breath.  Discussed the possibility of pneumonia, sepsis, pulmonary empyema, pulmonary embolism among other medical emergencies.  He declines transport to the hospital by ambulance as his wife is here with him.  Prefers that his wife take him there.  Patient's wife is agreeable and will take him to the emergency room now.   Wallis Bamberg, PA-C 10/13/22 1600

## 2022-10-13 NOTE — ED Notes (Signed)
Patient is being discharged from the Urgent Care and sent to the Emergency Department via POV (family) . Per [place], [place], patient is in need of higher level of care due to Acute respiratory failure with hypoxia. Patient is aware and verbalizes understanding of plan of care.  Vitals:   10/13/22 1518 10/13/22 1533  BP: 137/83   Pulse: (!) 111   Resp: (!) 25   Temp: 98.7 F (37.1 C)   SpO2: (!) 86% 94%

## 2022-10-13 NOTE — ED Triage Notes (Signed)
Pt reports cough, rib cage soreness and ear pain x  x 1 week. OTC meds gives no relief

## 2022-10-14 ENCOUNTER — Inpatient Hospital Stay (HOSPITAL_COMMUNITY): Payer: 59

## 2022-10-14 DIAGNOSIS — J9601 Acute respiratory failure with hypoxia: Secondary | ICD-10-CM | POA: Diagnosis not present

## 2022-10-14 DIAGNOSIS — R0609 Other forms of dyspnea: Secondary | ICD-10-CM

## 2022-10-14 DIAGNOSIS — J189 Pneumonia, unspecified organism: Secondary | ICD-10-CM

## 2022-10-14 DIAGNOSIS — A419 Sepsis, unspecified organism: Secondary | ICD-10-CM | POA: Diagnosis not present

## 2022-10-14 LAB — CBC
HCT: 37.4 % — ABNORMAL LOW (ref 39.0–52.0)
Hemoglobin: 11.6 g/dL — ABNORMAL LOW (ref 13.0–17.0)
MCH: 24.2 pg — ABNORMAL LOW (ref 26.0–34.0)
MCHC: 31 g/dL (ref 30.0–36.0)
MCV: 78.1 fL — ABNORMAL LOW (ref 80.0–100.0)
Platelets: 306 10*3/uL (ref 150–400)
RBC: 4.79 MIL/uL (ref 4.22–5.81)
RDW: 13.4 % (ref 11.5–15.5)
WBC: 24.5 10*3/uL — ABNORMAL HIGH (ref 4.0–10.5)
nRBC: 0 % (ref 0.0–0.2)

## 2022-10-14 LAB — ECHOCARDIOGRAM COMPLETE
Area-P 1/2: 4.96 cm2
Height: 69 in
S' Lateral: 3.3 cm
Weight: 4832 oz

## 2022-10-14 LAB — BASIC METABOLIC PANEL
Anion gap: 13 (ref 5–15)
BUN: 16 mg/dL (ref 6–20)
CO2: 25 mmol/L (ref 22–32)
Calcium: 8.1 mg/dL — ABNORMAL LOW (ref 8.9–10.3)
Chloride: 96 mmol/L — ABNORMAL LOW (ref 98–111)
Creatinine, Ser: 1.43 mg/dL — ABNORMAL HIGH (ref 0.61–1.24)
GFR, Estimated: >60 mL/min (ref >60)
Glucose, Bld: 113 mg/dL — ABNORMAL HIGH (ref 70–99)
Potassium: 3.2 mmol/L — ABNORMAL LOW (ref 3.5–5.1)
Sodium: 134 mmol/L — ABNORMAL LOW (ref 135–145)

## 2022-10-14 LAB — HIV ANTIBODY (ROUTINE TESTING W REFLEX): HIV Screen 4th Generation wRfx: NONREACTIVE

## 2022-10-14 LAB — PROCALCITONIN: Procalcitonin: 4.38 ng/mL

## 2022-10-14 LAB — PHOSPHORUS: Phosphorus: 2.4 mg/dL — ABNORMAL LOW (ref 2.5–4.6)

## 2022-10-14 LAB — TSH: TSH: 0.57 u[IU]/mL (ref 0.350–4.500)

## 2022-10-14 LAB — MAGNESIUM: Magnesium: 1.9 mg/dL (ref 1.7–2.4)

## 2022-10-14 LAB — BRAIN NATRIURETIC PEPTIDE: B Natriuretic Peptide: 210.7 pg/mL — ABNORMAL HIGH (ref 0.0–100.0)

## 2022-10-14 MED ORDER — SODIUM CHLORIDE 0.9 % IV SOLN
500.0000 mg | INTRAVENOUS | Status: DC
  Administered 2022-10-14 – 2022-10-15 (×2): 500 mg via INTRAVENOUS
  Filled 2022-10-14 (×2): qty 5

## 2022-10-14 MED ORDER — ACETAMINOPHEN 325 MG PO TABS: 650.0000 mg | ORAL_TABLET | Freq: Four times a day (QID) | ORAL | Status: DC | PRN

## 2022-10-14 MED ORDER — ENOXAPARIN SODIUM 60 MG/0.6ML IJ SOSY
60.0000 mg | PREFILLED_SYRINGE | INTRAMUSCULAR | Status: DC
  Administered 2022-10-14 – 2022-10-16 (×3): 60 mg via SUBCUTANEOUS
  Filled 2022-10-14 (×3): qty 0.6

## 2022-10-14 MED ORDER — HYDRALAZINE HCL 20 MG/ML IJ SOLN: 10.0000 mg | Freq: Four times a day (QID) | INTRAMUSCULAR | Status: DC | PRN

## 2022-10-14 MED ORDER — ORAL CARE MOUTH RINSE: 15.0000 mL | OROMUCOSAL | Status: DC | PRN

## 2022-10-14 MED ORDER — SODIUM CHLORIDE 0.9 % IV SOLN
1.0000 g | INTRAVENOUS | Status: DC
  Administered 2022-10-14 – 2022-10-15 (×2): 1 g via INTRAVENOUS
  Filled 2022-10-14 (×2): qty 10

## 2022-10-14 MED ORDER — POTASSIUM CHLORIDE 20 MEQ PO PACK
40.0000 meq | PACK | Freq: Once | ORAL | Status: AC
  Administered 2022-10-14: 40 meq via ORAL
  Filled 2022-10-14: qty 2

## 2022-10-14 MED ORDER — ALBUTEROL SULFATE (2.5 MG/3ML) 0.083% IN NEBU
2.5000 mg | INHALATION_SOLUTION | Freq: Four times a day (QID) | RESPIRATORY_TRACT | Status: DC
  Administered 2022-10-14 – 2022-10-16 (×9): 2.5 mg via RESPIRATORY_TRACT
  Filled 2022-10-14 (×9): qty 3

## 2022-10-14 MED ORDER — ACETAMINOPHEN 650 MG RE SUPP: 650.0000 mg | Freq: Four times a day (QID) | RECTAL | Status: DC | PRN

## 2022-10-14 NOTE — ED Notes (Signed)
ED TO INPATIENT HANDOFF REPORT  ED Nurse Name and Phone #:  Edyth Gunnels RN [phone]  S Name/Age/Gender Travis Jacobson 42 y.o. male Room/Bed: WA18/WA18  Code Status   Code Status: Full Code  Home/SNF/Other Home Patient oriented to: self, place, time, and situation Is this baseline? Yes   Triage Complete: Triage complete  Chief Complaint Acute respiratory failure (HCC) [J96.00]  Triage Note Pt arrived via POV. Referred from UC for low o2 sats. Pt satting between 92-96 on RA while in triage.C/o productive cough, SOB, and R ear pain for 1x week   Allergies No Known Allergies  Level of Care/Admitting Diagnosis ED Disposition     ED Disposition  Admit   Condition  --   Comment  Hospital Area: [facility] [[id number]]  Level of Care: Progressive [102]  Admit to Progressive based on following criteria: RESPIRATORY PROBLEMS hypoxemic/hypercapnic respiratory failure that is responsive to NIPPV (BiPAP) or High Flow Nasal Cannula (6-80 lpm). Frequent assessment/intervention, no > Q2 hrs < Q4 hrs, to maintain oxygenation and pulmonary hygiene.  May admit patient to [facility] or [facility] if equivalent level of care is available:: Yes  Covid Evaluation: Confirmed COVID Negative  Diagnosis: Acute respiratory failure (HCC) [518.81.ICD-9-CM]  Admitting Physician: Lorin Glass [[id number]]  Attending Physician: Lorin Glass [[id number]]  Certification:: I certify this patient will need inpatient services for at least 2 midnights  Estimated Length of Stay: 2          B Medical/Surgery History No past medical history on file. No past surgical history on file.   A IV Location/Drains/Wounds Patient Lines/Drains/Airways Status     Active Line/Drains/Airways     Name Placement date Placement time Site Days   Peripheral IV 10/13/22 20 G 1" Left Antecubital 10/13/22  1754  Antecubital  1            Intake/Output Last 24 hours  Intake/Output Summary (Last 24  hours) at 10/14/2022 0230 Last data filed at 10/13/2022 2259 Gross per 24 hour  Intake 1330 ml  Output --  Net 1330 ml    Labs/Imaging Results for orders placed or performed during the hospital encounter of 10/13/22 (from the past 48 hour(s))  Resp panel by RT-PCR (RSV, Flu A&B, Covid) Anterior Nasal Swab     Status: None   Collection Time: 10/13/22  4:56 PM   Specimen: Anterior Nasal Swab  Result Value Ref Range   SARS Coronavirus 2 by RT PCR NEGATIVE NEGATIVE    Comment: (NOTE) SARS-CoV-2 target nucleic acids are NOT DETECTED.  The SARS-CoV-2 RNA is generally detectable in upper respiratory specimens during the acute phase of infection. The lowest concentration of SARS-CoV-2 viral copies this assay can detect is 138 copies/mL. A negative result does not preclude SARS-Cov-2 infection and should not be used as the sole basis for treatment or other patient management decisions. A negative result may occur with  improper specimen collection/handling, submission of specimen other than nasopharyngeal swab, presence of viral mutation(s) within the areas targeted by this assay, and inadequate number of viral copies(<138 copies/mL). A negative result must be combined with clinical observations, patient history, and epidemiological information. The expected result is Negative.  Fact Sheet for Patients:  BloggerCourse.com  Fact Sheet for Healthcare Providers:  SeriousBroker.it  This test is no t yet approved or cleared by the [place] FDA and  has been authorized for detection and/or diagnosis of SARS-CoV-2 by FDA under an Emergency Use Authorization (EUA). This EUA will remain  in effect (meaning this test can be used) for the duration of the COVID-19 declaration under Section 564(b)(1) of the Act, 21 U.S.C.section 360bbb-3(b)(1), unless the authorization is terminated  or revoked sooner.       Influenza A by PCR NEGATIVE  NEGATIVE   Influenza B by PCR NEGATIVE NEGATIVE    Comment: (NOTE) The Xpert Xpress SARS-CoV-2/FLU/RSV plus assay is intended as an aid in the diagnosis of influenza from Nasopharyngeal swab specimens and should not be used as a sole basis for treatment. Nasal washings and aspirates are unacceptable for Xpert Xpress SARS-CoV-2/FLU/RSV testing.  Fact Sheet for Patients: BloggerCourse.com  Fact Sheet for Healthcare Providers: SeriousBroker.it  This test is not yet approved or cleared by the [place] FDA and has been authorized for detection and/or diagnosis of SARS-CoV-2 by FDA under an Emergency Use Authorization (EUA). This EUA will remain in effect (meaning this test can be used) for the duration of the COVID-19 declaration under Section 564(b)(1) of the Act, 21 U.S.C. section 360bbb-3(b)(1), unless the authorization is terminated or revoked.     Resp Syncytial Virus by PCR NEGATIVE NEGATIVE    Comment: (NOTE) Fact Sheet for Patients: BloggerCourse.com  Fact Sheet for Healthcare Providers: SeriousBroker.it  This test is not yet approved or cleared by the [place] FDA and has been authorized for detection and/or diagnosis of SARS-CoV-2 by FDA under an Emergency Use Authorization (EUA). This EUA will remain in effect (meaning this test can be used) for the duration of the COVID-19 declaration under Section 564(b)(1) of the Act, 21 U.S.C. section 360bbb-3(b)(1), unless the authorization is terminated or revoked.  Performed at [facility], [address] [place], [place] [address]   Lactic acid, plasma     Status: None   Collection Time: 10/13/22  5:53 PM  Result Value Ref Range   Lactic Acid, Venous 1.2 0.5 - 1.9 mmol/L    Comment: Performed at [facility], [address] [place], [place] [address]  Comprehensive metabolic  panel     Status: Abnormal   Collection Time: 10/13/22  5:57 PM  Result Value Ref Range   Sodium 130 (L) 135 - 145 mmol/L   Potassium 3.0 (L) 3.5 - 5.1 mmol/L   Chloride 91 (L) 98 - 111 mmol/L   CO2 27 22 - 32 mmol/L   Glucose, Bld 120 (H) 70 - 99 mg/dL    Comment: Glucose reference range applies only to samples taken after fasting for at least 8 hours.   BUN 17 6 - 20 mg/dL   Creatinine, Ser 1.32 (H) 0.61 - 1.24 mg/dL   Calcium 8.1 (L) 8.9 - 10.3 mg/dL   Total Protein 7.7 6.5 - 8.1 g/dL   Albumin 3.2 (L) 3.5 - 5.0 g/dL   AST 15 15 - 41 U/L   ALT 15 0 - 44 U/L   Alkaline Phosphatase 45 38 - 126 U/L   Total Bilirubin 0.9 0.3 - 1.2 mg/dL   GFR, Estimated 54 (L) >60 mL/min    Comment: (NOTE) Calculated using the CKD-EPI Creatinine Equation (2021)    Anion gap 12 5 - 15    Comment: Performed at [facility], [address], [place], [place] [address]  Troponin I (High Sensitivity)     Status: Abnormal   Collection Time: 10/13/22  5:57 PM  Result Value Ref Range   Troponin I (High Sensitivity) 100 (HH) <18 ng/L    Comment: CRITICAL RESULT CALLED TO, READ BACK  BY AND VERIFIED WITH RIVERS, T RN @ [phone] 10/13/22. GILBERTL (NOTE) Elevated high sensitivity troponin I (hsTnI) values and significant  changes across serial measurements may suggest ACS but many other  chronic and acute conditions are known to elevate hsTnI results.  Refer to the "Links" section for chest pain algorithms and additional  guidance. Performed at [facility], [address] [place], [place] [address]   Brain natriuretic peptide     Status: Abnormal   Collection Time: 10/13/22  5:57 PM  Result Value Ref Range   B Natriuretic Peptide 271.5 (H) 0.0 - 100.0 pg/mL    Comment: Performed at [facility], [address], [place], [place] [address]  CBC with Differential     Status: Abnormal   Collection Time: 10/13/22  5:57 PM  Result Value Ref Range   WBC  26.3 (H) 4.0 - 10.5 K/uL   RBC 5.11 4.22 - 5.81 MIL/uL   Hemoglobin 12.5 (L) 13.0 - 17.0 g/dL   HCT 19.1 47.8 - 29.5 %   MCV 78.3 (L) 80.0 - 100.0 fL   MCH 24.5 (L) 26.0 - 34.0 pg   MCHC 31.3 30.0 - 36.0 g/dL   RDW 62.1 30.8 - 65.7 %   Platelets 305 150 - 400 K/uL   nRBC 0.0 0.0 - 0.2 %   Neutrophils Relative % 83 %    Comment: Predominantly Neutrophils Seen   Neutro Abs 21.8 (H) 1.7 - 7.7 K/uL   Lymphocytes Relative 6 %   Lymphs Abs 1.6 0.7 - 4.0 K/uL   Monocytes Relative 7 %   Monocytes Absolute 1.8 (H) 0.1 - 1.0 K/uL   Eosinophils Relative 0 %   Eosinophils Absolute 0.1 0.0 - 0.5 K/uL   Basophils Relative 0 %   Basophils Absolute 0.1 0.0 - 0.1 K/uL   Immature Granulocytes 4 %   Abs Immature Granulocytes 0.99 (H) 0.00 - 0.07 K/uL    Comment: Performed at [facility], [address], [place], [place] [address]  Lactic acid, plasma     Status: None   Collection Time: 10/13/22  7:50 PM  Result Value Ref Range   Lactic Acid, Venous 1.7 0.5 - 1.9 mmol/L    Comment: Performed at [facility], [address], [place], [place] [address]  Troponin I (High Sensitivity)     Status: Abnormal   Collection Time: 10/13/22  7:50 PM  Result Value Ref Range   Troponin I (High Sensitivity) 85 (H) <18 ng/L    Comment: (NOTE) Elevated high sensitivity troponin I (hsTnI) values and significant  changes across serial measurements may suggest ACS but many other  chronic and acute conditions are known to elevate hsTnI results.  Refer to the "Links" section for chest pain algorithms and additional  guidance. Performed at [facility], [address] [place], [place] [address]   Culture, blood (routine x 2)     Status: None (Preliminary result)   Collection Time: 10/13/22  8:00 PM   Specimen: BLOOD  Result Value Ref Range   Specimen Description      BLOOD SITE NOT SPECIFIED Performed at [facility], [address]  [place], [place] [address]    Special Requests      BOTTLES DRAWN AEROBIC AND ANAEROBIC Blood Culture adequate volume Performed at [facility], [address], [place], [place] [address]    Culture PENDING    Report Status PENDING  Culture, blood (routine x 2)     Status: None (Preliminary result)   Collection Time: 10/13/22  8:08 PM   Specimen: BLOOD  Result Value Ref Range   Specimen Description      BLOOD SITE NOT SPECIFIED Performed at [facility], [address], [place], [place] [address]    Special Requests      BOTTLES DRAWN AEROBIC AND ANAEROBIC Blood Culture adequate volume Performed at [facility], [address], [place], [place] [address]    Culture PENDING    Report Status PENDING    CT Angio Chest PE W/Cm &/Or Wo Cm  Result Date: 10/13/2022 CLINICAL DATA:  Pulmonary embolism (PE) suspected, high prob Fever, shortness of breath, left-sided pain. EXAM: CT ANGIOGRAPHY CHEST WITH CONTRAST TECHNIQUE: Multidetector CT imaging of the chest was performed using the standard protocol during bolus administration of intravenous contrast. Multiplanar CT image reconstructions and MIPs were obtained to evaluate the vascular anatomy. RADIATION DOSE REDUCTION: This exam was performed according to the departmental dose-optimization program which includes automated exposure control, adjustment of the mA and/or kV according to patient size and/or use of iterative reconstruction technique. CONTRAST:  75mL OMNIPAQUE IOHEXOL 350 MG/ML SOLN COMPARISON:  Chest radiograph earlier today FINDINGS: Cardiovascular: There are no filling defects within the pulmonary arteries to suggest pulmonary embolus. Upper normal main pulmonary artery at 3.2 cm. The heart is mildly enlarged. No pericardial effusion. The thoracic aorta is normal in caliber without acute findings. Mediastinum/Nodes: Diffuse mediastinal and bilateral hilar adenopathy. Representative left lower  paratracheal node measures 14 mm short axis, series 5, image 47. Right hilar lymph node measures 17 mm short axis, series 5, image 57. patulous esophagus without wall thickening. Lungs/Pleura: Extensive multifocal airspace disease. Confluent consolidation involving the lateral left upper lobe and lingula, periphery of the right middle lobe and both lower lobes. Additional areas of nodular airspace disease throughout the upper lobes. There are small bilateral pleural effusions. Upper Abdomen: Hepatic steatosis. No acute upper abdominal findings. Musculoskeletal: There are no acute or suspicious osseous abnormalities. Review of the MIP images confirms the above findings. IMPRESSION: 1. No pulmonary embolus. 2. Extensive multifocal airspace disease, consistent with bilateral pneumonia. Small bilateral pleural effusions. 3. Mediastinal and bilateral hilar adenopathy is likely reactive, however consider follow-up CT after resolution of symptoms to resolution. 4. Mild cardiomegaly. Upper normal main pulmonary artery can be seen with pulmonary arterial hypertension. 5. Hepatic steatosis. Electronically Signed   By: Narda Rutherford M.D.   On: 10/13/2022 19:39   DG Chest 2 View  Result Date: 10/13/2022 CLINICAL DATA:  Shortness of breath and productive cough. EXAM: CHEST - 2 VIEW COMPARISON:  January 01, 2020 FINDINGS: The heart size and mediastinal contours are within normal limits. Low lung volumes are noted. Mild infiltrate is seen within the right lung base. Mild left basilar atelectasis is also suspected. There is no evidence of a pleural effusion or pneumothorax. The visualized skeletal structures are unremarkable. IMPRESSION: Low lung volumes with mild right basilar infiltrate and mild left basilar atelectasis. Electronically Signed   By: Aram Candela M.D.   On: 10/13/2022 18:30    Pending Labs Unresulted Labs (From admission, onward)     Start     Ordered   10/14/22 0500  Brain natriuretic peptide   Tomorrow morning,   R        10/13/22 2155   10/13/22 2137  Procalcitonin  Add-on,   AD       References:  Procalcitonin Lower Respiratory Tract Infection AND Sepsis Procalcitonin Algorithm   10/13/22 2136   Signed and Held  HIV Antibody (routine testing w rflx)  (HIV Antibody (Routine testing w reflex) panel)  Tomorrow morning,   R        Signed and Held   Signed and Held  Basic metabolic panel  Tomorrow morning,   R        Signed and Held   Signed and Held  CBC  Tomorrow morning,   R        Signed and Held   Signed and Held  Phosphorus  Tomorrow morning,   R        Signed and Held   Signed and Held  Magnesium  Tomorrow morning,   R        Signed and Held   Signed and Held  Expectorated Sputum Assessment w Gram Stain, Rflx to [employer]  Once,   R        Signed and Held   Signed and Held  TSH  Tomorrow morning,   R        Signed and Held            Vitals/Pain Today's Vitals   10/13/22 2000 10/13/22 2301 10/13/22 2330 10/14/22 0130  BP: 135/86  (!) 167/86 (!) 168/83  Pulse: (!) 103  (!) 101 100  Resp: (!) 29  17 (!) 48  Temp:  98.8 F (37.1 C)    TempSrc:  Oral    SpO2: 92%  92% 96%  Weight:      Height:      PainSc:        Isolation Precautions No active isolations  Medications Medications  guaiFENesin (MUCINEX) 12 hr tablet 600 mg (600 mg Oral Patient Refused/Not Given 10/14/22 0229)  0.9 %  sodium chloride infusion ( Intravenous New Bag/Given 10/14/22 0229)  ipratropium-albuterol (DUONEB) 0.5-2.5 (3) MG/3ML nebulizer solution 3 mL (3 mLs Nebulization Given 10/13/22 1758)  lactated ringers bolus 1,000 mL (0 mLs Intravenous Stopped 10/13/22 2259)  potassium chloride SA (KLOR-CON M) CR tablet 40 mEq (40 mEq Oral Given 10/13/22 1941)  iohexol (OMNIPAQUE) 350 MG/ML injection 75 mL (75 mLs Intravenous Contrast Given 10/13/22 1911)  cefTRIAXone (ROCEPHIN) 1 g in sodium chloride 0.9 % 100 mL IVPB (0 g Intravenous Stopped 10/13/22 2055)  azithromycin (ZITHROMAX) 500 mg in sodium  chloride 0.9 % 250 mL IVPB (0 mg Intravenous Stopped 10/13/22 2154)    Mobility walks     Focused Assessments Pulmonary Assessment Handoff:  Lung sounds: Bilateral Breath Sounds: Diminished, Expiratory wheezes L Breath Sounds: Diminished, Expiratory wheezes R Breath Sounds: Diminished, Expiratory wheezes O2 Device: Room Air      R Recommendations: See Admitting Provider Note  Report given to:   Additional Notes:

## 2022-10-14 NOTE — Progress Notes (Signed)
[facility]  PROGRESS NOTE  Travis Jacobson GNF:[id number] DOB: [birth year] DOA: 10/13/2022 PCP: Pcp, No   Brief HPI:   42 year old male with medical history of morbid obesity, complains of persistent cough, left-sided chest pain, right ear pain, shortness of breath, fever with temperature 101, malaise, fatigue for 1 week.  He was tested negative for COVID at home.  In the ED BNP was elevated at 271, chest x-ray showed low lung volumes with mild right basilar infiltrate and mild left basilar atelectasis.  CT chest did not show evidence of pulmonary embolism but showed bilateral extensive multifocal infiltrates, mild cardiomegaly, possible hypertension hepatic steatosis.  Blood cultures collected.  Patient was started on IV Rocephin and Zithromax.    Assessment/Plan:   Bilateral multifocal pneumonia -Clinically improved; WBC still elevated 24,000 -Acute respiratory failure with hypoxia -CT chest showed bilateral multifocal infiltrates -Started on IV Rocephin and Zithromax -Blood cultures x 2 showed no growth till date  Diastolic heart failure -Echocardiogram obtained today showed EF of 60 to 65% -Also showed grade 2 diastolic dysfunction -BNP 210.7 -Troponin 100, 85, likely from demand ischemia from above -Denies chest pain  Acute kidney injury versus CKD stage III -Creatinine 1.43, improved from yesterday  Hypokalemia -Potassium is 3.2 -Potassium replaced today -Follow BMP in am  Hyponatremia -Sodium is improved to 134  Morbid obesity -BMI 44.60 kg/m2 -Counseled for weight loss  Medications     albuterol  2.5 mg Nebulization Q6H   enoxaparin (LOVENOX) injection  60 mg Subcutaneous Q24H   guaiFENesin  600 mg Oral BID     Data Reviewed:   CBG:  No results for input(s): "GLUCAP" in the last 168 hours.  SpO2: 97 % O2 Flow Rate (L/min): 2 L/min    Vitals:   10/14/22 0130 10/14/22 0257 10/14/22 0632 10/14/22 1103  BP: (!) 168/83 (!) 160/92 (!) 159/90 (!) 145/88   Pulse: 100 100 89 85  Resp: (!) 48 (!) 22 (!) 24 16  Temp:  98.6 F (37 C) 98.9 F (37.2 C) 98.5 F (36.9 C)  TempSrc:  Oral Oral Oral  SpO2: 96% 95% 96% 97%  Weight:      Height:          Data Reviewed:  Basic Metabolic Panel: Recent Labs  Lab 10/13/22 1757 10/14/22 0447  NA 130* 134*  K 3.0* 3.2*  CL 91* 96*  CO2 27 25  GLUCOSE 120* 113*  BUN 17 16  CREATININE 1.63* 1.43*  CALCIUM 8.1* 8.1*  MG  --  1.9  PHOS  --  2.4*    CBC: Recent Labs  Lab 10/13/22 1757 10/14/22 0447  WBC 26.3* 24.5*  NEUTROABS 21.8*  --   HGB 12.5* 11.6*  HCT 40.0 37.4*  MCV 78.3* 78.1*  PLT 305 306    LFT Recent Labs  Lab 10/13/22 1757  AST 15  ALT 15  ALKPHOS 45  BILITOT 0.9  PROT 7.7  ALBUMIN 3.2*     Antibiotics: Anti-infectives (From admission, onward)    Start     Dose/Rate Route Frequency Ordered Stop   10/14/22 2100  azithromycin (ZITHROMAX) 500 mg in sodium chloride 0.9 % 250 mL IVPB        500 mg 250 mL/hr over 60 Minutes Intravenous Every 24 hours 10/14/22 0257 10/18/22 2059   10/14/22 2000  cefTRIAXone (ROCEPHIN) 1 g in sodium chloride 0.9 % 100 mL IVPB        1 g 200 mL/hr over 30 Minutes Intravenous Every 24  hours 10/14/22 0257     10/13/22 2000  cefTRIAXone (ROCEPHIN) 1 g in sodium chloride 0.9 % 100 mL IVPB        1 g 200 mL/hr over 30 Minutes Intravenous  Once 10/13/22 1947 10/13/22 2055   10/13/22 2000  azithromycin (ZITHROMAX) 500 mg in sodium chloride 0.9 % 250 mL IVPB        500 mg 250 mL/hr over 60 Minutes Intravenous  Once 10/13/22 1947 10/13/22 2154        DVT prophylaxis: Lovenox  Code Status: Full code   Family Communication:    CONSULTS    Subjective   Patient seen and examined, denies shortness of breath.   Objective    Physical Examination:   General-appears in no acute distress Heart-S1-S2, regular, no murmur auscultated Lungs-clear to auscultation bilaterally, no wheezing or crackles auscultated Abdomen-soft,  nontender, no organomegaly Extremities-no edema in the lower extremities Neuro-alert, oriented x3, no focal deficit noted   Status is: Inpatient:             Meredeth Ide   [facility] If 7PM-7AM, please contact night-coverage at [email or web address], Office  [phone]   10/14/2022, 3:46 PM  LOS: 1 day

## 2022-10-15 DIAGNOSIS — A419 Sepsis, unspecified organism: Secondary | ICD-10-CM | POA: Diagnosis not present

## 2022-10-15 DIAGNOSIS — J9601 Acute respiratory failure with hypoxia: Secondary | ICD-10-CM | POA: Diagnosis not present

## 2022-10-15 DIAGNOSIS — J189 Pneumonia, unspecified organism: Secondary | ICD-10-CM | POA: Diagnosis not present

## 2022-10-15 LAB — COMPREHENSIVE METABOLIC PANEL
ALT: 15 U/L (ref 0–44)
AST: 14 U/L — ABNORMAL LOW (ref 15–41)
Albumin: 2.9 g/dL — ABNORMAL LOW (ref 3.5–5.0)
Alkaline Phosphatase: 47 U/L (ref 38–126)
Anion gap: 11 (ref 5–15)
BUN: 13 mg/dL (ref 6–20)
CO2: 25 mmol/L (ref 22–32)
Calcium: 8.1 mg/dL — ABNORMAL LOW (ref 8.9–10.3)
Chloride: 101 mmol/L (ref 98–111)
Creatinine, Ser: 0.94 mg/dL (ref 0.61–1.24)
GFR, Estimated: >60 mL/min (ref >60)
Glucose, Bld: 108 mg/dL — ABNORMAL HIGH (ref 70–99)
Potassium: 3.3 mmol/L — ABNORMAL LOW (ref 3.5–5.1)
Sodium: 137 mmol/L (ref 135–145)
Total Bilirubin: 0.7 mg/dL (ref 0.3–1.2)
Total Protein: 7 g/dL (ref 6.5–8.1)

## 2022-10-15 LAB — CBC
HCT: 37.5 % — ABNORMAL LOW (ref 39.0–52.0)
Hemoglobin: 11.7 g/dL — ABNORMAL LOW (ref 13.0–17.0)
MCH: 24.7 pg — ABNORMAL LOW (ref 26.0–34.0)
MCHC: 31.2 g/dL (ref 30.0–36.0)
MCV: 79.1 fL — ABNORMAL LOW (ref 80.0–100.0)
Platelets: 339 10*3/uL (ref 150–400)
RBC: 4.74 MIL/uL (ref 4.22–5.81)
RDW: 13.8 % (ref 11.5–15.5)
WBC: 19.3 10*3/uL — ABNORMAL HIGH (ref 4.0–10.5)
nRBC: 0 % (ref 0.0–0.2)

## 2022-10-15 MED ORDER — FLUTICASONE PROPIONATE 50 MCG/ACT NA SUSP
2.0000 | Freq: Every day | NASAL | Status: DC
  Administered 2022-10-15 – 2022-10-16 (×2): 2 via NASAL
  Filled 2022-10-15: qty 16

## 2022-10-15 MED ORDER — POTASSIUM CHLORIDE 20 MEQ PO PACK
40.0000 meq | PACK | Freq: Once | ORAL | Status: AC
  Administered 2022-10-15: 40 meq via ORAL
  Filled 2022-10-15: qty 2

## 2022-10-15 MED ORDER — GUAIFENESIN ER 600 MG PO TB12
1200.0000 mg | ORAL_TABLET | Freq: Two times a day (BID) | ORAL | Status: DC
  Administered 2022-10-15 – 2022-10-16 (×2): 1200 mg via ORAL
  Filled 2022-10-15 (×2): qty 2

## 2022-10-15 NOTE — Progress Notes (Signed)
[facility]  PROGRESS NOTE  Travis Jacobson VWU:[id number] DOB: 01-Jan-1981 DOA: 10/13/2022 PCP: Pcp, No   Brief HPI:   42 year old male with medical history of morbid obesity, complains of persistent cough, left-sided chest pain, right ear pain, shortness of breath, fever with temperature 101, malaise, fatigue for 1 week.  He was tested negative for COVID at home.  In the ED BNP was elevated at 271, chest x-ray showed low lung volumes with mild right basilar infiltrate and mild left basilar atelectasis.  CT chest did not show evidence of pulmonary embolism but showed bilateral extensive multifocal infiltrates, mild cardiomegaly, possible hypertension hepatic steatosis.  Blood cultures collected.  Patient was started on IV Rocephin and Zithromax.    Assessment/Plan:   Bilateral multifocal pneumonia -Clinically improved; WBC is down to 19,000 -CT chest showed bilateral multifocal infiltrates -Started on IV Rocephin and Zithromax -Blood cultures x 2 showed no growth till date  Diastolic heart failure -Echocardiogram obtained today showed EF of 60 to 65% -Also showed grade 2 diastolic dysfunction -BNP 210.7 -Troponin 100, 85, likely from demand ischemia from above -Denies chest pain  Acute kidney injury versus CKD stage III -Creatinine is down to 0.94  Hypokalemia -Potassium is 3.3 -Replace potassium and follow BMP in am  Hyponatremia -Sodium is improved to 137  Morbid obesity -BMI 44.60 kg/m2 -Counseled for weight loss  Medications     albuterol  2.5 mg Nebulization Q6H   enoxaparin (LOVENOX) injection  60 mg Subcutaneous Q24H   guaiFENesin  600 mg Oral BID     Data Reviewed:   CBG:  No results for input(s): "GLUCAP" in the last 168 hours.  SpO2: 97 % O2 Flow Rate (L/min): 2 L/min    Vitals:   10/14/22 2316 10/15/22 0453 10/15/22 0757 10/15/22 0758  BP: (!) 143/85 (!) 166/123    Pulse: 94 82    Resp: 20 (!) 35    Temp: 98.6 F (37 C) 97.9 F (36.6 C)     TempSrc: Oral Oral    SpO2: 96% 100% 97% 97%  Weight:      Height:          Data Reviewed:  Basic Metabolic Panel: Recent Labs  Lab 10/13/22 1757 10/14/22 0447 10/15/22 0435  NA 130* 134* 137  K 3.0* 3.2* 3.3*  CL 91* 96* 101  CO2 27 25 25   GLUCOSE 120* 113* 108*  BUN 17 16 13   CREATININE 1.63* 1.43* 0.94  CALCIUM 8.1* 8.1* 8.1*  MG  --  1.9  --   PHOS  --  2.4*  --     CBC: Recent Labs  Lab 10/13/22 1757 10/14/22 0447 10/15/22 0435  WBC 26.3* 24.5* 19.3*  NEUTROABS 21.8*  --   --   HGB 12.5* 11.6* 11.7*  HCT 40.0 37.4* 37.5*  MCV 78.3* 78.1* 79.1*  PLT 305 306 339    LFT Recent Labs  Lab 10/13/22 1757 10/15/22 0435  AST 15 14*  ALT 15 15  ALKPHOS 45 47  BILITOT 0.9 0.7  PROT 7.7 7.0  ALBUMIN 3.2* 2.9*     Antibiotics: Anti-infectives (From admission, onward)    Start     Dose/Rate Route Frequency Ordered Stop   10/14/22 2100  azithromycin (ZITHROMAX) 500 mg in sodium chloride 0.9 % 250 mL IVPB        500 mg 250 mL/hr over 60 Minutes Intravenous Every 24 hours 10/14/22 0257 10/18/22 2059   10/14/22 2000  cefTRIAXone (ROCEPHIN) 1 g in sodium  chloride 0.9 % 100 mL IVPB        1 g 200 mL/hr over 30 Minutes Intravenous Every 24 hours 10/14/22 0257     10/13/22 2000  cefTRIAXone (ROCEPHIN) 1 g in sodium chloride 0.9 % 100 mL IVPB        1 g 200 mL/hr over 30 Minutes Intravenous  Once 10/13/22 1947 10/13/22 2055   10/13/22 2000  azithromycin (ZITHROMAX) 500 mg in sodium chloride 0.9 % 250 mL IVPB        500 mg 250 mL/hr over 60 Minutes Intravenous  Once 10/13/22 1947 10/13/22 2154        DVT prophylaxis: Lovenox  Code Status: Full code   Family Communication:    CONSULTS    Subjective   Still coughing up yellow-green phlegm.  Breathing has improved.  Complains of stuffiness of nose.   Objective    Physical Examination:   General-appears in no acute distress Heart-S1-S2, regular, no murmur auscultated Lungs-clear to  auscultation bilaterally, no wheezing or crackles auscultated Abdomen-soft, nontender, no organomegaly Extremities-no edema in the lower extremities Neuro-alert, oriented x3, no focal deficit noted   Status is: Inpatient:             Meredeth Ide   [facility] If 7PM-7AM, please contact night-coverage at [email or web address], Office  [phone]   10/15/2022, 9:59 AM  LOS: 2 days

## 2022-10-15 NOTE — TOC Transition Note (Signed)
Transition of Care [facility]) - CM/SW Discharge Note   Patient Details  Name: Travis Jacobson MRN: [id number] Date of Birth: [birth year]  [facility]) CM/SW Contact:  Larrie Kass, LCSW Phone Number: 10/15/2022, 2:25 PM   Clinical Narrative:    CSW spoke with pt, he reports needing assistance with establishing primary care. He reports starting at a new company and his insurance begins on the 1st of June, he is looking for [employer]. CSW suggested pt to contact his insurance company by calling the number on his insurance card to get [employer]. CSW also added [facility] to pt's AVS. Pt reports no DME needs and his wife will provide transportation at the time of d/c. No further TOC needs, TOC sign off.        Barriers to Discharge: Continued Medical Work up   Patient Goals and CMS Choice      Discharge Placement                         Discharge Plan and Services Additional resources added to the After Visit Summary for                                       Social Determinants of Health (SDOH) Interventions SDOH Screenings   Food Insecurity: No Food Insecurity (10/14/2022)  Housing: Low Risk  (10/14/2022)  Transportation Needs: No Transportation Needs (10/14/2022)  Utilities: Not At Risk (10/14/2022)  Tobacco Use: Low Risk  (10/13/2022)     Readmission Risk Interventions     No data to display

## 2022-10-16 ENCOUNTER — Encounter (HOSPITAL_COMMUNITY): Payer: Self-pay | Admitting: Internal Medicine

## 2022-10-16 DIAGNOSIS — J9601 Acute respiratory failure with hypoxia: Secondary | ICD-10-CM | POA: Diagnosis not present

## 2022-10-16 DIAGNOSIS — J189 Pneumonia, unspecified organism: Secondary | ICD-10-CM | POA: Diagnosis not present

## 2022-10-16 LAB — CBC
HCT: 36.9 % — ABNORMAL LOW (ref 39.0–52.0)
Hemoglobin: 11.3 g/dL — ABNORMAL LOW (ref 13.0–17.0)
MCH: 24.3 pg — ABNORMAL LOW (ref 26.0–34.0)
MCHC: 30.6 g/dL (ref 30.0–36.0)
MCV: 79.4 fL — ABNORMAL LOW (ref 80.0–100.0)
Platelets: 379 10*3/uL (ref 150–400)
RBC: 4.65 MIL/uL (ref 4.22–5.81)
RDW: 14.1 % (ref 11.5–15.5)
WBC: 18 10*3/uL — ABNORMAL HIGH (ref 4.0–10.5)
nRBC: 0 % (ref 0.0–0.2)

## 2022-10-16 LAB — BASIC METABOLIC PANEL
Anion gap: 9 (ref 5–15)
BUN: 8 mg/dL (ref 6–20)
CO2: 27 mmol/L (ref 22–32)
Calcium: 8.4 mg/dL — ABNORMAL LOW (ref 8.9–10.3)
Chloride: 102 mmol/L (ref 98–111)
Creatinine, Ser: 0.87 mg/dL (ref 0.61–1.24)
GFR, Estimated: >60 mL/min (ref >60)
Glucose, Bld: 120 mg/dL — ABNORMAL HIGH (ref 70–99)
Potassium: 3.6 mmol/L (ref 3.5–5.1)
Sodium: 138 mmol/L (ref 135–145)

## 2022-10-16 MED ORDER — FLUTICASONE PROPIONATE 50 MCG/ACT NA SUSP: 1.0000 | Freq: Every day | NASAL | 1 refills | Status: DC

## 2022-10-16 MED ORDER — ALBUTEROL SULFATE (2.5 MG/3ML) 0.083% IN NEBU: 2.5000 mg | INHALATION_SOLUTION | RESPIRATORY_TRACT | Status: DC | PRN

## 2022-10-16 MED ORDER — GUAIFENESIN ER 600 MG PO TB12: 1200.0000 mg | ORAL_TABLET | Freq: Two times a day (BID) | ORAL | 0 refills | Status: AC

## 2022-10-16 MED ORDER — LEVOFLOXACIN 750 MG PO TABS: 750.0000 mg | ORAL_TABLET | Freq: Every day | ORAL | 0 refills | Status: AC

## 2022-10-16 NOTE — Discharge Summary (Signed)
Physician Discharge Summary   Patient: Travis Jacobson MRN: [id number] DOB: 28-Jul-1980  Admit date:     10/13/2022  Discharge date: 10/16/22  Discharge Physician: Meredeth Ide   PCP: Pcp, No   Recommendations at discharge:   Follow-up PCP as outpatient He will need repeat chest x-ray in 4 weeks to check for resolution of pneumonia  Discharge Diagnoses: Principal Problem:   Acute respiratory failure (HCC) Active Problems:   Sepsis due to pneumonia (HCC)   Obesity, Class III, BMI 40-49.9 (morbid obesity) (HCC)  Resolved Problems:   * No resolved hospital problems. *  Hospital Course:  42 year old male with medical history of morbid obesity, complains of persistent cough, left-sided chest pain, right ear pain, shortness of breath, fever with temperature 101, malaise, fatigue for 1 week.  He was tested negative for COVID at home.  In the ED BNP was elevated at 271, chest x-ray showed low lung volumes with mild right basilar infiltrate and mild left basilar atelectasis.  CT chest did not show evidence of pulmonary embolism but showed bilateral extensive multifocal infiltrates, mild cardiomegaly, possible hypertension hepatic steatosis.  Blood cultures collected.  Patient was started on IV Rocephin and Zithromax.   Assessment and Plan:  Bilateral multifocal pneumonia -Clinically improved; WBC is down to 18,000 -Not requiring oxygen -CT chest showed bilateral multifocal infiltrates -Started on IV Rocephin and Zithromax -Blood cultures x 2 showed no growth till date -Will discharge on Levaquin 750 mg p.o. daily for 5 days   Diastolic heart failure -Echocardiogram obtained today showed EF of 60 to 65% -Also showed grade 2 diastolic dysfunction -BNP 210.7 -Troponin 100, 85, likely from demand ischemia from above -Denies chest pain   Acute kidney injury versus CKD stage III -Creatinine is down to 0.94  Nasal congestion -Improved with Flonase, will send home on Flonase -Mucinex for 5  more days   Hypokalemia -Replete   Hyponatremia -Sodium is improved to 138   Morbid obesity -BMI 44.60 kg/m2 -Counseled for weight loss       Consultants:  Procedures performed:  Disposition: Home Diet recommendation:  Discharge Diet Orders (From admission, onward)     Start     Ordered   10/16/22 0000  Diet - low sodium heart healthy        10/16/22 1055           Regular diet DISCHARGE MEDICATION: Allergies as of 10/16/2022   No Known Allergies      Medication List     STOP taking these medications    naproxen sodium 220 MG tablet Commonly known as: ALEVE   pseudoephedrine-guaifenesin 60-600 MG 12 hr tablet Commonly known as: MUCINEX D       TAKE these medications    fluticasone 50 MCG/ACT nasal spray Commonly known as: FLONASE Place 1 spray into both nostrils daily. Start taking on: October 17, 2022   guaiFENesin 600 MG 12 hr tablet Commonly known as: MUCINEX Take 2 tablets (1,200 mg total) by mouth 2 (two) times daily for 5 days.   ibuprofen 200 MG tablet Commonly known as: ADVIL Take 200 mg by mouth every 6 (six) hours as needed for moderate pain.   levofloxacin 750 MG tablet Commonly known as: Levaquin Take 1 tablet (750 mg total) by mouth daily for 5 days.        Follow-up Information     [facility] Follow up.   Why: Need repeat CXR in 4 weeks Contact information: [address] [place] [address] [phone]               Discharge Exam: Ceasar Mons Weights   10/13/22 1653  Weight: (!) 137 kg   General-appears in no acute distress Heart-S1-S2, regular, no murmur auscultated Lungs-clear to auscultation bilaterally, no wheezing or crackles auscultated Abdomen-soft, nontender, no organomegaly Extremities-no edema in the lower extremities Neuro-alert, oriented x3, no focal deficit noted  Condition at discharge: good  The results of significant diagnostics  from this hospitalization (including imaging, microbiology, ancillary and laboratory) are listed below for reference.   Imaging Studies: ECHOCARDIOGRAM COMPLETE  Result Date: 10/14/2022    ECHOCARDIOGRAM REPORT   Patient Name:   PRANISH AKHAVAN Date of Exam: 10/14/2022 Medical Rec #:  [id number]    Height:       69.0 in Accession #:    [id number]   Weight:       302.0 lb Date of Birth:  August 08, 1980    BSA:          2.461 m Patient Age:    42 years     BP:           159/90 mmHg Patient Gender: M            HR:           93 bpm. Exam Location:  Inpatient Procedure: 2D Echo, Color Doppler and Cardiac Doppler Indications:    R06.9 DOE  History:        Patient has no prior history of Echocardiogram examinations.  Sonographer:    Irving Burton Senior RDCS Referring Phys: [id number] [facility]  Sonographer Comments: Suboptimal apical window due to lung interference (active pneumonia) IMPRESSIONS  1. Left ventricular ejection fraction, by estimation, is 60 to 65%. Left ventricular ejection fraction by PLAX is 66 %. The left ventricle has normal function. The left ventricle has no regional wall motion abnormalities. There is moderate concentric left ventricular hypertrophy. Left ventricular diastolic parameters are consistent with Grade II diastolic dysfunction (pseudonormalization).  2. Right ventricular systolic function is normal. The right ventricular size is normal. Tricuspid regurgitation signal is inadequate for assessing PA pressure.  3. Left atrial size was mildly dilated.  4. The mitral valve is grossly normal. Trivial mitral valve regurgitation.  5. The aortic valve is tricuspid. Aortic valve regurgitation is not visualized. No aortic stenosis is present.  6. The inferior vena cava is normal in size with greater than 50% respiratory variability, suggesting right atrial pressure of 3 mmHg. Comparison(s): No prior Echocardiogram. FINDINGS  Left Ventricle: Left ventricular ejection fraction, by estimation, is 60 to 65%. Left  ventricular ejection fraction by PLAX is 66 %. The left ventricle has normal function. The left ventricle has no regional wall motion abnormalities. The left ventricular internal cavity size was normal in size. There is moderate concentric left ventricular hypertrophy. Left ventricular diastolic parameters are consistent with Grade II diastolic dysfunction (pseudonormalization). Right Ventricle: The right ventricular size is normal. No increase in right ventricular wall thickness. Right ventricular systolic function is normal. Tricuspid regurgitation signal is inadequate for assessing PA pressure. Left Atrium: Left atrial size was mildly dilated. Right Atrium: Right atrial size was normal in size. Pericardium: There is no evidence of pericardial effusion. Mitral Valve: The mitral valve is grossly normal. Mild mitral annular calcification. Trivial mitral valve regurgitation. Tricuspid Valve: The tricuspid valve is grossly normal. Tricuspid valve regurgitation is trivial. Aortic Valve: The aortic valve is tricuspid. There is mild aortic valve  annular calcification. Aortic valve regurgitation is not visualized. No aortic stenosis is present. Pulmonic Valve: The pulmonic valve was grossly normal. Pulmonic valve regurgitation is trivial. Aorta: The aortic root is normal in size and structure. Venous: The inferior vena cava is normal in size with greater than 50% respiratory variability, suggesting right atrial pressure of 3 mmHg. IAS/Shunts: No atrial level shunt detected by color flow Doppler.  LEFT VENTRICLE PLAX 2D LV EF:         Left            Diastology                ventricular     LV e' medial:    6.53 cm/s                ejection        LV E/e' medial:  14.8                fraction by     LV e' lateral:   6.85 cm/s                PLAX is 66      LV E/e' lateral: 14.1                %. LVIDd:         5.20 cm LVIDs:         3.30 cm LV PW:         1.40 cm LV IVS:        1.40 cm LVOT diam:     2.50 cm LV SV:          115 LV SV Index:   47 LVOT Area:     4.91 cm  RIGHT VENTRICLE RV S prime:     12.00 cm/s TAPSE (M-mode): 1.8 cm LEFT ATRIUM             Index        RIGHT ATRIUM           Index LA diam:        4.20 cm 1.71 cm/m   RA Area:     19.50 cm LA Vol (A2C):   81.2 ml 32.99 ml/m  RA Volume:   54.40 ml  22.10 ml/m LA Vol (A4C):   89.0 ml 36.16 ml/m LA Biplane Vol: 87.4 ml 35.51 ml/m  AORTIC VALVE LVOT Vmax:   131.00 cm/s LVOT Vmean:  97.000 cm/s LVOT VTI:    0.235 m  AORTA Ao Root diam: 3.30 cm Ao Asc diam:  3.20 cm MITRAL VALVE MV Area (PHT): 4.96 cm    SHUNTS MV Decel Time: 153 msec    Systemic VTI:  0.24 m MV E velocity: 96.40 cm/s  Systemic Diam: 2.50 cm MV A velocity: 66.80 cm/s MV E/A ratio:  1.44 Nona Dell MD Electronically signed by Nona Dell MD Signature Date/Time: 10/14/2022/1:08:54 PM    Final    CT Angio Chest PE W/Cm &/Or Wo Cm  Result Date: 10/13/2022 CLINICAL DATA:  Pulmonary embolism (PE) suspected, high prob Fever, shortness of breath, left-sided pain. EXAM: CT ANGIOGRAPHY CHEST WITH CONTRAST TECHNIQUE: Multidetector CT imaging of the chest was performed using the standard protocol during bolus administration of intravenous contrast. Multiplanar CT image reconstructions and MIPs were obtained to evaluate the vascular anatomy. RADIATION DOSE REDUCTION: This exam was performed according to the departmental dose-optimization program which includes automated exposure control, adjustment of the mA and/or kV according to  patient size and/or use of iterative reconstruction technique. CONTRAST:  75mL OMNIPAQUE IOHEXOL 350 MG/ML SOLN COMPARISON:  Chest radiograph earlier today FINDINGS: Cardiovascular: There are no filling defects within the pulmonary arteries to suggest pulmonary embolus. Upper normal main pulmonary artery at 3.2 cm. The heart is mildly enlarged. No pericardial effusion. The thoracic aorta is normal in caliber without acute findings. Mediastinum/Nodes: Diffuse mediastinal and  bilateral hilar adenopathy. Representative left lower paratracheal node measures 14 mm short axis, series 5, image 47. Right hilar lymph node measures 17 mm short axis, series 5, image 57. patulous esophagus without wall thickening. Lungs/Pleura: Extensive multifocal airspace disease. Confluent consolidation involving the lateral left upper lobe and lingula, periphery of the right middle lobe and both lower lobes. Additional areas of nodular airspace disease throughout the upper lobes. There are small bilateral pleural effusions. Upper Abdomen: Hepatic steatosis. No acute upper abdominal findings. Musculoskeletal: There are no acute or suspicious osseous abnormalities. Review of the MIP images confirms the above findings. IMPRESSION: 1. No pulmonary embolus. 2. Extensive multifocal airspace disease, consistent with bilateral pneumonia. Small bilateral pleural effusions. 3. Mediastinal and bilateral hilar adenopathy is likely reactive, however consider follow-up CT after resolution of symptoms to resolution. 4. Mild cardiomegaly. Upper normal main pulmonary artery can be seen with pulmonary arterial hypertension. 5. Hepatic steatosis. Electronically Signed   By: Narda Rutherford M.D.   On: 10/13/2022 19:39   DG Chest 2 View  Result Date: 10/13/2022 CLINICAL DATA:  Shortness of breath and productive cough. EXAM: CHEST - 2 VIEW COMPARISON:  January 01, 2020 FINDINGS: The heart size and mediastinal contours are within normal limits. Low lung volumes are noted. Mild infiltrate is seen within the right lung base. Mild left basilar atelectasis is also suspected. There is no evidence of a pleural effusion or pneumothorax. The visualized skeletal structures are unremarkable. IMPRESSION: Low lung volumes with mild right basilar infiltrate and mild left basilar atelectasis. Electronically Signed   By: Aram Candela M.D.   On: 10/13/2022 18:30    Microbiology: Results for orders placed or performed during the hospital  encounter of 10/13/22  Resp panel by RT-PCR (RSV, Flu A&B, Covid) Anterior Nasal Swab     Status: None   Collection Time: 10/13/22  4:56 PM   Specimen: Anterior Nasal Swab  Result Value Ref Range Status   SARS Coronavirus 2 by RT PCR NEGATIVE NEGATIVE Final    Comment: (NOTE) SARS-CoV-2 target nucleic acids are NOT DETECTED.  The SARS-CoV-2 RNA is generally detectable in upper respiratory specimens during the acute phase of infection. The lowest concentration of SARS-CoV-2 viral copies this assay can detect is 138 copies/mL. A negative result does not preclude SARS-Cov-2 infection and should not be used as the sole basis for treatment or other patient management decisions. A negative result may occur with  improper specimen collection/handling, submission of specimen other than nasopharyngeal swab, presence of viral mutation(s) within the areas targeted by this assay, and inadequate number of viral copies(<138 copies/mL). A negative result must be combined with clinical observations, patient history, and epidemiological information. The expected result is Negative.  Fact Sheet for Patients:  BloggerCourse.com  Fact Sheet for Healthcare Providers:  SeriousBroker.it  This test is no t yet approved or cleared by the [place] FDA and  has been authorized for detection and/or diagnosis of SARS-CoV-2 by FDA under an Emergency Use Authorization (EUA). This EUA will remain  in effect (meaning this test can be used) for the duration of the COVID-19  declaration under Section 564(b)(1) of the Act, 21 U.S.C.section 360bbb-3(b)(1), unless the authorization is terminated  or revoked sooner.       Influenza A by PCR NEGATIVE NEGATIVE Final   Influenza B by PCR NEGATIVE NEGATIVE Final    Comment: (NOTE) The Xpert Xpress SARS-CoV-2/FLU/RSV plus assay is intended as an aid in the diagnosis of influenza from Nasopharyngeal swab specimens  and should not be used as a sole basis for treatment. Nasal washings and aspirates are unacceptable for Xpert Xpress SARS-CoV-2/FLU/RSV testing.  Fact Sheet for Patients: BloggerCourse.com  Fact Sheet for Healthcare Providers: SeriousBroker.it  This test is not yet approved or cleared by the [place] FDA and has been authorized for detection and/or diagnosis of SARS-CoV-2 by FDA under an Emergency Use Authorization (EUA). This EUA will remain in effect (meaning this test can be used) for the duration of the COVID-19 declaration under Section 564(b)(1) of the Act, 21 U.S.C. section 360bbb-3(b)(1), unless the authorization is terminated or revoked.     Resp Syncytial Virus by PCR NEGATIVE NEGATIVE Final    Comment: (NOTE) Fact Sheet for Patients: BloggerCourse.com  Fact Sheet for Healthcare Providers: SeriousBroker.it  This test is not yet approved or cleared by the [place] FDA and has been authorized for detection and/or diagnosis of SARS-CoV-2 by FDA under an Emergency Use Authorization (EUA). This EUA will remain in effect (meaning this test can be used) for the duration of the COVID-19 declaration under Section 564(b)(1) of the Act, 21 U.S.C. section 360bbb-3(b)(1), unless the authorization is terminated or revoked.  Performed at [facility], [address], [place], [place] [address]   Culture, blood (routine x 2)     Status: None (Preliminary result)   Collection Time: 10/13/22  8:00 PM   Specimen: BLOOD  Result Value Ref Range Status   Specimen Description   Final    BLOOD SITE NOT SPECIFIED Performed at [facility], [address] [place], [place] [address]    Special Requests   Final    BOTTLES DRAWN AEROBIC AND ANAEROBIC Blood Culture adequate volume Performed at [facility], [address]  [place], [place] [address]    Culture   Final    NO GROWTH 3 DAYS Performed at [facility], [address] [place], [place] [address]    Report Status PENDING  Incomplete  Culture, blood (routine x 2)     Status: None (Preliminary result)   Collection Time: 10/13/22  8:08 PM   Specimen: BLOOD  Result Value Ref Range Status   Specimen Description   Final    BLOOD SITE NOT SPECIFIED Performed at [facility], [address] [place], [place] [address]    Special Requests   Final    BOTTLES DRAWN AEROBIC AND ANAEROBIC Blood Culture adequate volume Performed at [facility], [address] [place], [place] [address]    Culture   Final    NO GROWTH 3 DAYS Performed at [facility], [address], [place], [place] [address]    Report Status PENDING  Incomplete    Labs: CBC: Recent Labs  Lab 10/13/22 1757 10/14/22 0447 10/15/22 0435 10/16/22 0419  WBC 26.3* 24.5* 19.3* 18.0*  NEUTROABS 21.8*  --   --   --   HGB 12.5* 11.6* 11.7* 11.3*  HCT 40.0 37.4* 37.5* 36.9*  MCV 78.3* 78.1* 79.1* 79.4*  PLT 305 306 339 379   Basic Metabolic  Panel: Recent Labs  Lab 10/13/22 1757 10/14/22 0447 10/15/22 0435 10/16/22 0419  NA 130* 134* 137 138  K 3.0* 3.2* 3.3* 3.6  CL 91* 96* 101 102  CO2 27 25 25 27   GLUCOSE 120* 113* 108* 120*  BUN 17 16 13 8   CREATININE 1.63* 1.43* 0.94 0.87  CALCIUM 8.1* 8.1* 8.1* 8.4*  MG  --  1.9  --   --   PHOS  --  2.4*  --   --    Liver Function Tests: Recent Labs  Lab 10/13/22 1757 10/15/22 0435  AST 15 14*  ALT 15 15  ALKPHOS 45 47  BILITOT 0.9 0.7  PROT 7.7 7.0  ALBUMIN 3.2* 2.9*   CBG: No results for input(s): "GLUCAP" in the last 168 hours.  Discharge time spent: greater than 30 minutes.  Signed: Meredeth Ide, MD [facility] 10/16/2022

## 2022-10-16 NOTE — Progress Notes (Signed)
Patient provided with discharge education, patient verbalized understanding. IV removed.  

## 2022-10-18 LAB — CULTURE, BLOOD (ROUTINE X 2)
Culture: NO GROWTH
Culture: NO GROWTH
Special Requests: ADEQUATE
Special Requests: ADEQUATE

## 2022-11-02 ENCOUNTER — Ambulatory Visit (INDEPENDENT_AMBULATORY_CARE_PROVIDER_SITE_OTHER): Payer: 59 | Admitting: Family

## 2022-11-02 ENCOUNTER — Encounter: Payer: Self-pay | Admitting: Family

## 2022-11-02 VITALS — BP 152/101 | HR 82 | Temp 97.8°F | Ht 69.0 in | Wt 308.4 lb

## 2022-11-02 DIAGNOSIS — I10 Essential (primary) hypertension: Secondary | ICD-10-CM | POA: Diagnosis not present

## 2022-11-02 DIAGNOSIS — K760 Fatty (change of) liver, not elsewhere classified: Secondary | ICD-10-CM

## 2022-11-02 DIAGNOSIS — Z6841 Body Mass Index (BMI) 40.0 and over, adult: Secondary | ICD-10-CM | POA: Diagnosis not present

## 2022-11-02 DIAGNOSIS — R739 Hyperglycemia, unspecified: Secondary | ICD-10-CM | POA: Diagnosis not present

## 2022-11-02 DIAGNOSIS — Z23 Encounter for immunization: Secondary | ICD-10-CM

## 2022-11-02 DIAGNOSIS — J189 Pneumonia, unspecified organism: Secondary | ICD-10-CM

## 2022-11-02 LAB — CBC WITH DIFFERENTIAL/PLATELET
Basophils Absolute: 0 10*3/uL (ref 0.0–0.1)
Basophils Relative: 0.6 % (ref 0.0–3.0)
Eosinophils Absolute: 0.3 10*3/uL (ref 0.0–0.7)
Eosinophils Relative: 4 % (ref 0.0–5.0)
HCT: 39 % (ref 39.0–52.0)
Hemoglobin: 12.3 g/dL — ABNORMAL LOW (ref 13.0–17.0)
Lymphocytes Relative: 23.9 % (ref 12.0–46.0)
Lymphs Abs: 1.7 10*3/uL (ref 0.7–4.0)
MCHC: 31.6 g/dL (ref 30.0–36.0)
MCV: 76.4 fl — ABNORMAL LOW (ref 78.0–100.0)
Monocytes Absolute: 0.5 10*3/uL (ref 0.1–1.0)
Monocytes Relative: 7.4 % (ref 3.0–12.0)
Neutro Abs: 4.6 10*3/uL (ref 1.4–7.7)
Neutrophils Relative %: 64.1 % (ref 43.0–77.0)
Platelets: 304 10*3/uL (ref 150.0–400.0)
RBC: 5.11 Mil/uL (ref 4.22–5.81)
RDW: 14.5 % (ref 11.5–15.5)
WBC: 7.1 10*3/uL (ref 4.0–10.5)

## 2022-11-02 LAB — BASIC METABOLIC PANEL
BUN: 10 mg/dL (ref 6–23)
CO2: 33 mEq/L — ABNORMAL HIGH (ref 19–32)
Calcium: 9.5 mg/dL (ref 8.4–10.5)
Chloride: 102 mEq/L (ref 96–112)
Creatinine, Ser: 0.93 mg/dL (ref 0.40–1.50)
GFR: 102.05 mL/min (ref >60.00)
Glucose, Bld: 89 mg/dL (ref 70–99)
Potassium: 3.9 mEq/L (ref 3.5–5.1)
Sodium: 142 mEq/L (ref 135–145)

## 2022-11-02 LAB — HEMOGLOBIN A1C: Hgb A1c MFr Bld: 6.3 % (ref 4.6–6.5)

## 2022-11-02 MED ORDER — AMLODIPINE BESYLATE 2.5 MG PO TABS
2.5000 mg | ORAL_TABLET | Freq: Every day | ORAL | 1 refills | Status: DC
Start: 2022-11-02 — End: 2022-11-10

## 2022-11-02 NOTE — Patient Instructions (Signed)
Welcome to [facility], It was a pleasure meeting you today!  I will review your lab results via MyChart in a few days.  As discussed, I have sent your blood pressure medicine to your pharmacy.  See the attached handout for more information. Eat a low sodium diet, increase water intake to 8 cups daily at least, and exercise as able to exercise your heart muscle.  I have sent an order to our [facility] radiology on [facility]. Go there in one week to get your chest Xray done. This is a walk in clinic, no appointment needed.  Please schedule a 1 month follow up visit today.  Start working on what we discussed today regarding your weight loss goals:  1) Eat small portions, ok to eat 6 mini meals vs. 3 big meals if this controls your hunger  better. 2) Eat until full and then STOP! This is your body telling you it has had enough. 3) Drink at least 64 oz water = 2 liters = 8, 8oz cups DAILY. 4) Eat most of your calories earlier in the day (if you work during the day).  Want to eat within a 8-10hour window if possible. No eating after 7pm, only no calorie drinks or water from 7p until bedtime. 5) Look for low carb, high protein foods/recipes. Avoid simple carbs including sweets, white bread, rice, potatoes, pasta. Look for whole grain/whole wheat/or almond flour if eating these foods. 6) High protein foods: meats (limit red meat), including [place], chicken, pork, FISH (best source) nuts, cheese, beans (black beans, soy/edamame beans are best). Protein drinks, bars are also good but must have low sugar, look for < 10 grams. 7) Avoid processed/refined sugar and artificial sweeteners if possible. Stevia, Erythritol, or Monk fruit sweetener are preferred, natural, no calorie sweeteners.  Natural sweeteners like Agave or honey in very small amounts are ok also.    PLEASE NOTE: If you had any LAB tests please let us know if you have not heard back within a few days. You may see  your results on MyChart before we have a chance to review them but we will give you a call once they are reviewed by [place]. If we ordered any REFERRALS today, please let us know if you have not heard from their office within the next week.  Let us know through MyChart if you are needing REFILLS, or have your pharmacy send [place] the request. You can also use MyChart to communicate with me or any office staff.

## 2022-11-02 NOTE — Progress Notes (Signed)
New Patient Office Visit  Subjective:  Patient ID: Travis Jacobson, male    DOB: 1981-02-19  Age: 42 y.o. MRN: [id number]  CC:  Chief Complaint  Patient presents with   Establish Care    NP/establish care, discuss weight management, check right ear still hearing a echo after ear infection and pneumonia.     HPI Travis Jacobson presents for establishing care today.  PNA:  recent ER visit for bilateral PNA leading to sepsis w/AKI, last WBC 18K. Given Levaquin x 5d, 2 IV abt infusions in ER. 2D echo found grade II LV diastolic dysfunction & LV hypertrophy. Today pt reports cough is much better, no fever or SOB.  Obesity:  states he has more weight gain over the last year, had been told he was pre-diabetic in past. He states he has started reducing calories, changed his work so he is walking more, since having resp failure & told about his heart & liver problems he wants to get in shape.  Hypertension: Patient is currently maintained on the following medications for blood pressure: None, new DX. Failed meds include: none Patient reports good compliance with blood pressure medications. Patient denies chest pain, headaches, shortness of breath or swelling. Last 3 blood pressure readings in our office are as follows: BP Readings from Last 3 Encounters:  11/02/22 (!) 152/101  10/16/22 (!) 153/98  10/13/22 137/83     Assessment & Plan:  Pneumonia of both lungs due to infectious organism, unspecified part of lung - needs f/u CXR, repeat CBC -last WBC 18K. Pt feeling much better.  -     DG Chest 2 View; Future -     CBC with Differential/Platelet  Essential (primary) hypertension- Assessment & Plan: new, though believe he has had long time, undiagnosed starting low dose Amlodipine, 2.5mg , advised on use & SE advised on lifestyle modifications f/u 1 month  Orders: -     Basic metabolic panel -     amLODIPine Besylate; Take 1 tablet (2.5 mg total) by mouth daily.  Dispense: 30 tablet;  Refill: 1  Obesity, Class III, BMI 40-49.9 (morbid obesity) (HCC)- Assessment & Plan: chronic, unstable pt advised on dangers of his weight to his heart & all organs he is wanting to make changes Wt. Loss strategies reviewed including portion control, less carbs including sweets, eating most of calories earlier in day, drinking 64oz water qd, and establishing daily exercise routine.   Hepatic steatosis- Assessment & Plan: found on CT scan in ER pt advised only tx is weight loss, low sat fat diet pt will return for fasting lipids will continue to monitor  Elevated blood sugar -     Hemoglobin A1c  Need for Tdap vaccination -     Tdap vaccine greater than or equal to 7yo IM   Subjective:    Outpatient Medications Prior to Visit  Medication Sig Dispense Refill   fluticasone (FLONASE) 50 MCG/ACT nasal spray Place 1 spray into both nostrils daily. 15.8 mL 1   ibuprofen (ADVIL) 200 MG tablet Take 200 mg by mouth every 6 (six) hours as needed for moderate pain.     No facility-administered medications prior to visit.   Past Medical History:  Diagnosis Date   Acute respiratory failure (HCC) 10/13/2022   Past Surgical History:  Procedure Laterality Date   surgery on finger Left 2000    Objective:   Today's Vitals: BP (!) 152/101 (BP Location: Right Arm, Patient Position: Sitting, Cuff Size: Large)   Pulse 82  Temp 97.8 F (36.6 C) (Temporal)   Ht 5\' 9"  (1.753 m)   Wt (!) 308 lb 6.4 oz (139.9 kg)   SpO2 96%   BMI 45.54 kg/m   Physical Exam Vitals and nursing note reviewed.  Constitutional:      General: He is not in acute distress.    Appearance: Normal appearance.  HENT:     Head: Normocephalic.  Cardiovascular:     Rate and Rhythm: Normal rate and regular rhythm.  Pulmonary:     Effort: Pulmonary effort is normal.     Breath sounds: Normal breath sounds.  Musculoskeletal:        General: Normal range of motion.     Cervical back: Normal range of motion.   Skin:    General: Skin is warm and dry.  Neurological:     Mental Status: He is alert and oriented to person, place, and time.  Psychiatric:        Mood and Affect: Mood normal.    Meds ordered this encounter  Medications   amLODipine (NORVASC) 2.5 MG tablet    Sig: Take 1 tablet (2.5 mg total) by mouth daily.    Dispense:  30 tablet    Refill:  1    Order Specific Question:   Supervising Provider    Answer:   ANDY, CAMILLE L [[id number]]   Dulce Sellar, NP

## 2022-11-03 ENCOUNTER — Encounter: Payer: Self-pay | Admitting: Family

## 2022-11-03 DIAGNOSIS — K760 Fatty (change of) liver, not elsewhere classified: Secondary | ICD-10-CM | POA: Insufficient documentation

## 2022-11-03 NOTE — Assessment & Plan Note (Signed)
found on CT scan in ER pt advised only tx is weight loss, low sat fat diet pt will return for fasting lipids will continue to monitor

## 2022-11-03 NOTE — Assessment & Plan Note (Signed)
new, though believe he has had long time, undiagnosed starting low dose Amlodipine, 2.5mg , advised on use & SE advised on lifestyle modifications f/u 1 month

## 2022-11-03 NOTE — Assessment & Plan Note (Signed)
chronic, unstable pt advised on dangers of his weight to his heart & all organs he is wanting to make changes Wt. Loss strategies reviewed including portion control, less carbs including sweets, eating most of calories earlier in day, drinking 64oz water qd, and establishing daily exercise routine.

## 2022-11-06 ENCOUNTER — Telehealth: Payer: Self-pay | Admitting: Family

## 2022-11-06 NOTE — Telephone Encounter (Signed)
Patient scheduled OV for 7/5 listing chest pains as reasoning.   FYI: This call has been transferred to triage nurse: Access Nurse. Once the result note has been entered staff can address the message at that time.  Patient called in with the following symptoms:  Red Word: Chest pain--stated more focus in left lung area; States has sharp pain when inhaling; onset couple of days; noticed began after starting amlodipine    Please advise at Mobile [phone] (mobile)  Message is routed to Provider Pool.

## 2022-11-06 NOTE — Telephone Encounter (Signed)
Final outcome: Patient advised to go to ED. Appointment still set for 7/5 until otherwise advised.    Patient Name First: Travis Last: Jacobson Gender: Male DOB: Sep 12, 1980 Age: 42 Y 5 M 7 D Return Phone Number: [phone] (Primary) Address: City/ State/ Zip: [place]  [address] Client [facility] - Administrator, [facility] at [facility] Contact Type Call Who Is Calling Patient / Member / Family / Caregiver Call Type Triage / Clinical Relationship To Patient Self Return Phone Number [phone] (Primary) Chief Complaint CHEST PAIN - pain, pressure, heaviness or tightness Reason for Call Symptomatic / Request for Health Information Initial Comment Caller states he is having chest pain. Starting 2 days after he started amlodipine. PCP Dulce Sellar Translation No Nurse Assessment Nurse: Tresa Endo, RN, Lurena Joiner Date/Time Lamount Cohen Time): 11/06/2022 3:54:26 PM Confirm and document reason for call. If symptomatic, describe symptoms. ---Caller states he has been having chest pain x3 days since starting on amlodipine for HTN. Has a history of HTN last reading 150/101. Does the patient have any new or worsening symptoms? ---Yes Will a triage be completed? ---Yes Related visit to physician within the last 2 weeks? ---No Does the PT have any chronic conditions? (i.e. diabetes, asthma, this includes High risk factors for pregnancy, etc.) ---Yes List chronic conditions. ---HTN Is this a behavioral health or substance abuse call? ---No Guidelines Guideline Title Affirmed Question Affirmed Notes Nurse Date/Time (Eastern Time) Chest Pain Long-distance travel in past month (e.g., car, bus, train, plane; with trip lasting 6 or more hours) Tresa Endo, RN, Lurena Joiner 11/06/2022 3:57:05 PM Disp. Time Lamount Cohen Time) Disposition Final User 11/06/2022 3:52:45 PM Send to Urgent Queue Asencion Gowda 11/06/2022 4:07:51 PM Go to ED Now Yes  Tresa Endo, RN, Lurena Joiner Final Disposition 11/06/2022 4:07:51 PM Go to ED Now Yes Tresa Endo, RN, Cecil Cobbs Disagree/Comply Comply Caller Understands Yes PreDisposition Call Doctor

## 2022-11-07 ENCOUNTER — Encounter: Payer: Self-pay | Admitting: Family

## 2022-11-07 NOTE — Telephone Encounter (Signed)
I called pt in regards to message below,    Call T and ask how he is doing, did he go to the ER for his chest pain? Yes, Pt states no blood clots were found.  Has he had it again? No chest pains or other sx since.  Is his blood pressure still high? Pt states he is unable to check BP at home due to not having a BP cuff, pt is going to get a cuff/check with insurance but in the mean time he will be checking BP at [facility].   I think his chest pain or tightness is due to the high blood pressure vs the medicine.  I want him to keep his appointment on Friday, but he can go ahead & take 2 pills = 5mg  of the Amlodipine and call us back with his BP readings. He needs to wait 1 hour after taking the medicine to see any change.  But if he feels any sharp pain in his chest, or pressure like an elephant is sitting on his chest, or pain going down his left arm with or without tingling, or pain radiating to his jaw, he needs to go to the ER!  Let me know what he says, thx.    Pt gave a verbalized understanding to instructions above. Pt also stated his hearing his much better and insurance does cover weight loss medication discussed with you.

## 2022-11-08 NOTE — Telephone Encounter (Signed)
Noted, thanks!

## 2022-11-10 ENCOUNTER — Ambulatory Visit (INDEPENDENT_AMBULATORY_CARE_PROVIDER_SITE_OTHER): Payer: 59 | Admitting: Family

## 2022-11-10 ENCOUNTER — Encounter: Payer: Self-pay | Admitting: Family

## 2022-11-10 VITALS — BP 151/90 | HR 84 | Temp 97.6°F | Ht 69.0 in | Wt 307.2 lb

## 2022-11-10 DIAGNOSIS — I10 Essential (primary) hypertension: Secondary | ICD-10-CM | POA: Diagnosis not present

## 2022-11-10 DIAGNOSIS — E119 Type 2 diabetes mellitus without complications: Secondary | ICD-10-CM | POA: Insufficient documentation

## 2022-11-10 DIAGNOSIS — I152 Hypertension secondary to endocrine disorders: Secondary | ICD-10-CM

## 2022-11-10 DIAGNOSIS — E1169 Type 2 diabetes mellitus with other specified complication: Secondary | ICD-10-CM | POA: Diagnosis not present

## 2022-11-10 DIAGNOSIS — Z7985 Long-term (current) use of injectable non-insulin antidiabetic drugs: Secondary | ICD-10-CM

## 2022-11-10 DIAGNOSIS — E1159 Type 2 diabetes mellitus with other circulatory complications: Secondary | ICD-10-CM

## 2022-11-10 MED ORDER — AMLODIPINE BESYLATE 5 MG PO TABS
7.5000 mg | ORAL_TABLET | Freq: Every day | ORAL | 1 refills | Status: DC
Start: 2022-11-10 — End: 2022-12-07

## 2022-11-10 MED ORDER — TIRZEPATIDE 2.5 MG/0.5ML ~~LOC~~ SOAJ
2.5000 mg | SUBCUTANEOUS | 1 refills | Status: DC
Start: 2022-11-10 — End: 2022-12-07

## 2022-11-10 NOTE — Assessment & Plan Note (Signed)
A1C 6.3 with HTN & morbid obesity sending Mounjaro 2.5mg  qweek to see if covered, pt reports his insurance app says it is for diabetes, but does not give A1C criteria reports Ozempic & Victoza also covered with same info for [facility] - Tier 2 f/u 1-2 months if med started

## 2022-11-10 NOTE — Progress Notes (Signed)
Patient ID: Travis Jacobson, male    DOB: [birth year], 42 y.o.   MRN: [id number]  Chief Complaint  Patient presents with   Hypertension    Pt states his chest pains has went away but BP has still been high.     HPI: High A1C and obesity:  pt A1C at physical 6.3, pt also has morbid obesity and HTN. He is motivated to lose weight and wants to see what his options are. Pt has changed diet   Hypertension: Patient is currently maintained on the following medications for blood pressure: Amlodipine 5mg  qd Failed meds include: none. Patient reports good compliance with blood pressure medications. Patient denies chest pain, headaches, shortness of breath or swelling. Last 3 blood pressure readings in our office are as follows: BP Readings from Last 3 Encounters:  11/10/22 (!) 153/93  11/02/22 (!) 152/101  10/16/22 (!) 153/98     Assessment & Plan:  Obesity, diabetes, and hypertension syndrome (HCC) Assessment & Plan: A1C 6.3 with HTN & morbid obesity sending Mounjaro 2.5mg  qweek to see if covered, pt reports his insurance app says it is for diabetes, but does not give A1C criteria reports Ozempic & Victoza also covered with same info for [facility] - Tier 2 f/u 1-2 months if med started  Orders: -     Tirzepatide; Inject 2.5 mg into the skin once a week.  Dispense: 2 mL; Refill: 1  Obesity, Class III, BMI 40-49.9 (morbid obesity) (HCC) -     Tirzepatide; Inject 2.5 mg into the skin once a week.  Dispense: 2 mL; Refill: 1  Essential (primary) hypertension Assessment & Plan: new dx, but believe a chronic problem started Amlodipine, 2.5mg  last visit, pt called in & BP still high, so increased dose to 5mg  and still high advised to increase to 7.5mg  every day, sending refill continue to advise on lifestyle modifications f/u 1-2 month  Orders: -     amLODIPine Besylate; Take 1.5 tablets (7.5 mg total) by mouth daily.  Dispense: 90 tablet; Refill: 1   Subjective:    Outpatient  Medications Prior to Visit  Medication Sig Dispense Refill   fluticasone (FLONASE) 50 MCG/ACT nasal spray Place 1 spray into both nostrils daily. 15.8 mL 1   amLODipine (NORVASC) 2.5 MG tablet Take 1 tablet (2.5 mg total) by mouth daily. 30 tablet 1   No facility-administered medications prior to visit.   Past Medical History:  Diagnosis Date   Acute respiratory failure (HCC) 10/13/2022   Sepsis due to pneumonia (HCC) 10/13/2022   Past Surgical History:  Procedure Laterality Date   surgery on finger Left 2000   No Known Allergies    Objective:    Physical Exam Vitals and nursing note reviewed.  Constitutional:      General: He is not in acute distress.    Appearance: Normal appearance. He is morbidly obese.  HENT:     Head: Normocephalic.  Cardiovascular:     Rate and Rhythm: Normal rate and regular rhythm.  Pulmonary:     Effort: Pulmonary effort is normal.     Breath sounds: Normal breath sounds.  Musculoskeletal:        General: Normal range of motion.     Cervical back: Normal range of motion.  Skin:    General: Skin is warm and dry.  Neurological:     Mental Status: He is alert and oriented to person, place, and time.  Psychiatric:        Mood and Affect:  Mood normal.    BP (!) 153/93 (BP Location: Left Arm, Patient Position: Sitting, Cuff Size: Large)   Pulse 84   Temp 97.6 F (36.4 C) (Temporal)   Ht 5\' 9"  (1.753 m)   Wt (!) 307 lb 4 oz (139.4 kg)   SpO2 98%   BMI 45.37 kg/m  Wt Readings from Last 3 Encounters:  11/10/22 (!) 307 lb 4 oz (139.4 kg)  11/02/22 (!) 308 lb 6.4 oz (139.9 kg)  10/13/22 (!) 302 lb (137 kg)      Dulce Sellar, NP

## 2022-11-10 NOTE — Assessment & Plan Note (Signed)
new dx, but believe a chronic problem started Amlodipine, 2.5mg  last visit, pt called in & BP still high, so increased dose to 5mg  and still high advised to increase to 7.5mg  every day, sending refill continue to advise on lifestyle modifications f/u 1-2 month

## 2022-11-15 ENCOUNTER — Telehealth: Payer: Self-pay | Admitting: Pharmacy Technician

## 2022-11-15 ENCOUNTER — Other Ambulatory Visit (HOSPITAL_COMMUNITY): Payer: Self-pay

## 2022-11-15 NOTE — Telephone Encounter (Signed)
Pharmacy Patient Advocate Encounter   Received notification that prior authorization for Travis Jacobson is required/requested.   PA submitted to [facility] via telephone at [phone]  # [id number] Status is pending

## 2022-11-15 NOTE — Telephone Encounter (Signed)
-----   Message from Dulce Sellar, NP sent at 11/10/2022  2:41 PM EDT ----- Regarding: Mounjaro Please run PA for pt mounjaro, A1C 6.3, but also has HTN, morbid obesity, thanks.

## 2022-11-16 ENCOUNTER — Other Ambulatory Visit (HOSPITAL_COMMUNITY): Payer: Self-pay

## 2022-11-17 ENCOUNTER — Other Ambulatory Visit (HOSPITAL_COMMUNITY): Payer: Self-pay

## 2022-11-17 NOTE — Telephone Encounter (Signed)
I called pt in regards to message below. Pt has already started and has been on it for 5 days. Pt states no nausea or other sx at this time.

## 2022-11-17 NOTE — Telephone Encounter (Signed)
Pharmacy Patient Advocate Encounter  Received notification from [facility] that Prior Authorization for Travis Jacobson has been Approved.  PA #/Case ID/Reference #: [id number]

## 2022-11-22 NOTE — Telephone Encounter (Signed)
Let T know - he can apply OTC hydrocortisone cream to rash on arm - let me know if worsens or has more breakouts.  Regarding the exercise bike at work - is this something they already have and he needs me to give permission to use? Or he wants to bring in to work to use? Not sure why a note is required? I only provide notes stating it is safe to use exercise equipment that an employer offers.  Thx

## 2022-11-23 ENCOUNTER — Ambulatory Visit (INDEPENDENT_AMBULATORY_CARE_PROVIDER_SITE_OTHER)
Admission: RE | Admit: 2022-11-23 | Discharge: 2022-11-23 | Disposition: A | Discharge status: Home / Self Care | Payer: 59 | Source: Ambulatory Visit | Attending: Family | Admitting: Family

## 2022-11-23 ENCOUNTER — Other Ambulatory Visit: Payer: Self-pay | Admitting: Family

## 2022-11-23 DIAGNOSIS — J189 Pneumonia, unspecified organism: Secondary | ICD-10-CM

## 2022-11-23 NOTE — Telephone Encounter (Signed)
yes, fine to add physical to visit, should be fasting. I can write a letter stating it is recommended to exercise for his health and safe for him to do as his job allows during work hours.

## 2022-12-07 ENCOUNTER — Ambulatory Visit (INDEPENDENT_AMBULATORY_CARE_PROVIDER_SITE_OTHER): Payer: 59 | Admitting: Family

## 2022-12-07 VITALS — BP 150/98 | HR 77 | Temp 97.5°F | Ht 69.0 in | Wt 307.6 lb

## 2022-12-07 DIAGNOSIS — E1169 Type 2 diabetes mellitus with other specified complication: Secondary | ICD-10-CM | POA: Diagnosis not present

## 2022-12-07 DIAGNOSIS — Z6841 Body Mass Index (BMI) 40.0 and over, adult: Secondary | ICD-10-CM

## 2022-12-07 DIAGNOSIS — I10 Essential (primary) hypertension: Secondary | ICD-10-CM

## 2022-12-07 DIAGNOSIS — Z7985 Long-term (current) use of injectable non-insulin antidiabetic drugs: Secondary | ICD-10-CM

## 2022-12-07 DIAGNOSIS — E669 Obesity, unspecified: Secondary | ICD-10-CM | POA: Diagnosis not present

## 2022-12-07 DIAGNOSIS — E1159 Type 2 diabetes mellitus with other circulatory complications: Secondary | ICD-10-CM

## 2022-12-07 DIAGNOSIS — I152 Hypertension secondary to endocrine disorders: Secondary | ICD-10-CM

## 2022-12-07 DIAGNOSIS — R21 Rash and other nonspecific skin eruption: Secondary | ICD-10-CM

## 2022-12-07 MED ORDER — TIRZEPATIDE 5 MG/0.5ML ~~LOC~~ SOAJ
5.0000 mg | SUBCUTANEOUS | 0 refills | Status: DC
Start: 2022-12-07 — End: 2022-12-31

## 2022-12-07 MED ORDER — AMLODIPINE BESYLATE 5 MG PO TABS
5.0000 mg | ORAL_TABLET | Freq: Two times a day (BID) | ORAL | 1 refills | Status: DC
Start: 2022-12-07 — End: 2023-01-10

## 2022-12-07 MED ORDER — TRIAMCINOLONE ACETONIDE 0.1 % EX CREA: 1.0000 | TOPICAL_CREAM | Freq: Two times a day (BID) | CUTANEOUS | 2 refills | Status: DC

## 2022-12-07 MED ORDER — AMLODIPINE BESYLATE 5 MG PO TABS
10.0000 mg | ORAL_TABLET | Freq: Every day | ORAL | Status: DC
Start: 2022-12-07 — End: 2022-12-07

## 2022-12-07 NOTE — Assessment & Plan Note (Signed)
new dx, but believe a chronic problem Amlodipine 7.5mg  qam BP still high has changed diet, increased exercise continue to advise on lifestyle modifications advised to increase to 5mg  bid message if continued high readings f/u 3 months with fasting labs

## 2022-12-07 NOTE — Assessment & Plan Note (Signed)
A1C 6.3 with HTN & morbid obesity Mounjaro 2.5mg  qweek, tolerating, reports he developed a mild rash on right arm, and then right shoulder, pinpoint cluster of bumps, not itchy- using OTC cortisone but not going away sending Mounjaro 5mg , advised on SE to watch for, let me know rash worsens f/u 3 months

## 2022-12-07 NOTE — Progress Notes (Signed)
Patient ID: Travis Jacobson, male    DOB: 1980/08/25, 42 y.o.   MRN: [id number]  Chief Complaint  Patient presents with   Annual Exam    Fasting w/ labs    Hypertension    Pt states he took Amlodipine at 8:15am   Diabetes    HPI: Rash:  started after pt started [facility], right arm and right shoulder, pinpoint clusters of bumps, non-itchy, has been using OTC hydrocortisone which is not resolving rash.  High A1C and obesity:  pt A1C at physical 6.3, pt also has morbid obesity and HTN. He is motivated to lose weight and wants to see what his options are. Pt has changed diet, eating less sweets; Started exercising more, 5d/week, including some weight training.   Hypertension: Patient is currently maintained on the following medications for blood pressure: Amlodipine 7.5mg  qd Failed meds include: none. Patient reports good compliance with blood pressure medications. Patient denies chest pain, headaches, shortness of breath or swelling. Last 3 blood pressure readings in our office are as follows: BP Readings from Last 3 Encounters:  12/07/22 (!) 150/98  11/13/22 (!) 151/90  11/02/22 (!) 152/101    Assessment & Plan:  Essential (primary) hypertension Assessment & Plan: new dx, but believe a chronic problem Amlodipine 7.5mg  qam BP still high has changed diet, increased exercise continue to advise on lifestyle modifications advised to increase to 5mg  bid message if continued high readings f/u 3 months with fasting labs  Orders: -     amLODIPine Besylate; Take 1 tablet (5 mg total) by mouth 2 (two) times daily.  Dispense: 180 tablet; Refill: 1  Obesity, diabetes, and hypertension syndrome (HCC) Assessment & Plan: A1C 6.3 with HTN & morbid obesity Mounjaro 2.5mg  qweek, tolerating, reports he developed a mild rash on right arm, and then right shoulder, pinpoint cluster of bumps, not itchy- using OTC cortisone but not going away sending Mounjaro 5mg , advised on SE to watch for, let me  know rash worsens f/u 3 months   Orders: -     Tirzepatide; Inject 5 mg into the skin once a week.  Dispense: 6 mL; Refill: 0  Skin rash -     Triamcinolone Acetonide; Apply 1 Application topically 2 (two) times daily. Mix 1 pea size drop with pea size drop of body lotion. Apply to rash patches.  Dispense: 30 g; Refill: 2   Subjective:    Outpatient Medications Prior to Visit  Medication Sig Dispense Refill   fluticasone (FLONASE) 50 MCG/ACT nasal spray Place 1 spray into both nostrils daily. 15.8 mL 1   amLODipine (NORVASC) 5 MG tablet Take 1.5 tablets (7.5 mg total) by mouth daily. 90 tablet 1   tirzepatide (MOUNJARO) 2.5 MG/0.5ML Pen Inject 2.5 mg into the skin once a week. 2 mL 1   No facility-administered medications prior to visit.   Past Medical History:  Diagnosis Date   Acute respiratory failure (HCC) 10/13/2022   Sepsis due to pneumonia (HCC) 10/13/2022   Past Surgical History:  Procedure Laterality Date   surgery on finger Left 2000   No Known Allergies    Objective:    Physical Exam Vitals and nursing note reviewed.  Constitutional:      General: He is not in acute distress.    Appearance: Normal appearance. He is obese.  HENT:     Head: Normocephalic.  Cardiovascular:     Rate and Rhythm: Normal rate and regular rhythm.  Pulmonary:     Effort: Pulmonary effort is  normal.     Breath sounds: Normal breath sounds.  Musculoskeletal:        General: Normal range of motion.     Cervical back: Normal range of motion.  Skin:    General: Skin is warm and dry.     Findings: Rash (right forearm, approx 5cm cluster of pinpoint size bumps, no erythema; same on right shoulder) present.  Neurological:     Mental Status: He is alert and oriented to person, place, and time.  Psychiatric:        Mood and Affect: Mood normal.    BP (!) 150/98   Pulse 77   Temp (!) 97.5 F (36.4 C) (Temporal)   Ht 5\' 9"  (1.753 m)   Wt (!) 307 lb 9.6 oz (139.5 kg)   SpO2 98%    BMI 45.42 kg/m  Wt Readings from Last 3 Encounters:  12/07/22 (!) 307 lb 9.6 oz (139.5 kg)  11/10/22 (!) 307 lb 4 oz (139.4 kg)  11/02/22 (!) 308 lb 6.4 oz (139.9 kg)       Dulce Sellar, NP

## 2022-12-21 ENCOUNTER — Encounter (INDEPENDENT_AMBULATORY_CARE_PROVIDER_SITE_OTHER): Payer: Self-pay

## 2022-12-27 ENCOUNTER — Other Ambulatory Visit: Payer: Self-pay | Admitting: Family

## 2022-12-27 DIAGNOSIS — I10 Essential (primary) hypertension: Secondary | ICD-10-CM

## 2022-12-31 ENCOUNTER — Other Ambulatory Visit: Payer: Self-pay | Admitting: Family

## 2022-12-31 DIAGNOSIS — E1159 Type 2 diabetes mellitus with other circulatory complications: Secondary | ICD-10-CM

## 2023-01-01 ENCOUNTER — Other Ambulatory Visit: Payer: Self-pay | Admitting: Medical Genetics

## 2023-01-01 DIAGNOSIS — Z006 Encounter for examination for normal comparison and control in clinical research program: Secondary | ICD-10-CM

## 2023-01-01 MED ORDER — TIRZEPATIDE 5 MG/0.5ML ~~LOC~~ SOAJ
5.0000 mg | SUBCUTANEOUS | 0 refills | Status: DC
Start: 2023-01-01 — End: 2023-01-12

## 2023-01-07 ENCOUNTER — Other Ambulatory Visit: Payer: Self-pay | Admitting: Family

## 2023-01-07 DIAGNOSIS — I10 Essential (primary) hypertension: Secondary | ICD-10-CM

## 2023-01-09 ENCOUNTER — Encounter: Payer: Self-pay | Admitting: Pharmacist

## 2023-01-11 NOTE — Patient Instructions (Addendum)
It was very nice to see you today!   I will review your lab results via MyChart in a few days.  Keep up the good work with your workouts and diet!  Have a great weekend!    PLEASE NOTE:  If you had any lab tests please let us know if you have not heard back within a few days. You may see your results on MyChart before we have a chance to review them but we will give you a call once they are reviewed by [place]. If we ordered any referrals today, please let us know if you have not heard from their office within the next week.

## 2023-01-11 NOTE — Progress Notes (Signed)
Phone: [phone]  Subjective:  Patient 42 y.o. male presenting for annual physical.  Chief Complaint  Patient presents with   Annual Exam    Fasting w/ labs   HPI: T2DM: Pt is currently maintained on the following medications for diabetes:  Failed meds include: none Denies polyuria/polydipsia/polyphagia Denies hypoglycemia Home glucose readings range:   Last A1C was  Lab Results  Component Value Date   HGBA1C 6.3 11/02/2022   Hypertension: Patient is currently maintained on the following medications for blood pressure: Amlodipine 10mg  qd Failed meds include: none. Patient reports good compliance with blood pressure medications. Patient denies chest pain, headaches, shortness of breath or swelling. Last 3 blood pressure readings in our office are as follows: BP Readings from Last 3 Encounters:  01/12/23 138/86  12/07/22 (!) 150/98  11/13/22 (!) 151/90    See problem oriented charting- ROS- full  review of systems was completed and negative  except for: T2DM, HTN noted in HPI above.  The following were reviewed and entered/updated in epic: Past Medical History:  Diagnosis Date   Acute respiratory failure (HCC) 10/13/2022   Sepsis due to pneumonia (HCC) 10/13/2022   Patient Active Problem List   Diagnosis Date Noted   Obesity, diabetes, and hypertension syndrome (HCC) 11/10/2022   Hepatic steatosis 11/03/2022   Essential (primary) hypertension 11/02/2022   Obesity, Class III, BMI 40-49.9 (morbid obesity) (HCC) 10/13/2022   Past Surgical History:  Procedure Laterality Date   surgery on finger Left 2000    Family History  Problem Relation Age of Onset   Hypertension Mother    Healthy Father     Medications- reviewed and updated Current Outpatient Medications  Medication Sig Dispense Refill   hydrochlorothiazide (HYDRODIURIL) 12.5 MG tablet Take 1 tablet (12.5 mg total) by mouth in the morning. For blood pressure. 90 tablet 1   tirzepatide (MOUNJARO) 7.5  MG/0.5ML Pen Inject 7.5 mg into the skin once a week. 6 mL 1   triamcinolone cream (KENALOG) 0.1 % Apply 1 Application topically 2 (two) times daily. Mix 1 pea size drop with pea size drop of body lotion. Apply to rash patches. 30 g 2   amLODipine (NORVASC) 10 MG tablet Take 1 tablet (10 mg total) by mouth daily. 90 tablet 1   No current facility-administered medications for this visit.    Allergies-reviewed and updated No Known Allergies  Social History   Social History Narrative   Not on file    Objective:  BP 138/86   Pulse 74   Temp (!) 97.3 F (36.3 C) (Temporal)   Ht 5\' 9"  (1.753 m)   Wt (!) 312 lb 6 oz (141.7 kg)   SpO2 99%   BMI 46.13 kg/m  Physical Exam Vitals and nursing note reviewed.  Constitutional:      General: He is not in acute distress.    Appearance: Normal appearance. He is obese.  HENT:     Head: Normocephalic.     Right Ear: Tympanic membrane and external ear normal.     Left Ear: Tympanic membrane and external ear normal.     Nose: Nose normal.     Mouth/Throat:     Mouth: Mucous membranes are moist.  Eyes:     Extraocular Movements: Extraocular movements intact.  Cardiovascular:     Rate and Rhythm: Normal rate and regular rhythm.  Pulmonary:     Effort: Pulmonary effort is normal.     Breath sounds: Normal breath sounds.  Abdominal:  General: Abdomen is flat. There is no distension.     Palpations: Abdomen is soft.     Tenderness: There is no abdominal tenderness.  Musculoskeletal:        General: Normal range of motion.     Cervical back: Normal range of motion.  Skin:    General: Skin is warm and dry.  Neurological:     Mental Status: He is alert and oriented to person, place, and time.  Psychiatric:        Mood and Affect: Mood normal.        Behavior: Behavior normal.        Judgment: Judgment normal.      Assessment and Plan   Health Maintenance counseling: 1. Anticipatory guidance: Patient counseled regarding regular  dental exams q6 months, eye exams yearly, avoiding smoking and second hand smoke, limiting alcohol to 2 beverages per day.   2. Risk factor reduction:  Advised patient of need for regular exercise and diet rich in fruits and vegetables to reduce risk of heart attack and stroke.    Wt Readings from Last 3 Encounters:  01/12/23 (!) 312 lb 6 oz (141.7 kg)  12/07/22 (!) 307 lb 9.6 oz (139.5 kg)  11/10/22 (!) 307 lb 4 oz (139.4 kg)   3. Immunizations/screenings/ancillary studies Immunization History  Administered Date(s) Administered   Tdap 11/02/2022   Health Maintenance Due  Topic Date Due   OPHTHALMOLOGY EXAM  Never done   Hepatitis C Screening  Never done    4. Skin cancer screening-  advised regular sunscreen use. Denies worrisome, changing, or new skin lesions.  5. Smoking associated screening: non- smoker  6. STD screening - N/A 7. Alcohol screening: none  Annual physical exam recently had labs done.  Obesity, diabetes, and hypertension syndrome (HCC) Assessment & Plan: A1C 6.3 with HTN & morbid obesity Mounjaro 5mg  qweek, tolerating, reports arm rash is a little better, has not worsened, no other SE wt has gone down not up & pt discouraged as he has continued to work out, giving detailed examples of his workouts as well as eating healthier, advised on trying to cut total calories per day, eat same foods but slightly smaller portions increasing dose to 7.5mg , continue to remind pt of possible SE  checking microalbuminuria today f/u 3 months   Orders: -     Microalbumin / creatinine urine ratio; Future -     Tirzepatide; Inject 7.5 mg into the skin once a week.  Dispense: 6 mL; Refill: 1  Essential (primary) hypertension Assessment & Plan: chronic, stable Amlodipine 10mg  qam,  BP still a little high has changed diet, increased exercise, but weight has not changed continue to advise on lifestyle modifications adding HCTZ 12.5mg , advised on use & SE message if continued  high readings f/u 3 months  Orders: -     hydroCHLOROthiazide; Take 1 tablet (12.5 mg total) by mouth in the morning. For blood pressure.  Dispense: 90 tablet; Refill: 1 -     amLODIPine Besylate; Take 1 tablet (10 mg total) by mouth daily.  Dispense: 90 tablet; Refill: 1  Need for hepatitis C screening test -     Hepatitis C antibody; Future   Recommended follow up: No follow-ups on file. Future Appointments  Date Time Provider Department Center  04/13/2023  9:40 AM Dulce Sellar, NP [facility]  01/16/2024  9:00 AM Dulce Sellar, NP [facility]    Lab/Order associations:  fasting   Dulce Sellar, NP

## 2023-01-12 ENCOUNTER — Ambulatory Visit: Payer: 59 | Admitting: Family

## 2023-01-12 ENCOUNTER — Other Ambulatory Visit: Payer: 59

## 2023-01-12 ENCOUNTER — Other Ambulatory Visit (INDEPENDENT_AMBULATORY_CARE_PROVIDER_SITE_OTHER): Payer: 59

## 2023-01-12 ENCOUNTER — Encounter: Payer: Self-pay | Admitting: Family

## 2023-01-12 ENCOUNTER — Ambulatory Visit (INDEPENDENT_AMBULATORY_CARE_PROVIDER_SITE_OTHER): Payer: 59 | Admitting: Family

## 2023-01-12 VITALS — BP 138/86 | HR 74 | Temp 97.3°F | Ht 69.0 in | Wt 312.4 lb

## 2023-01-12 DIAGNOSIS — E1159 Type 2 diabetes mellitus with other circulatory complications: Secondary | ICD-10-CM | POA: Diagnosis not present

## 2023-01-12 DIAGNOSIS — Z1379 Encounter for other screening for genetic and chromosomal anomalies: Secondary | ICD-10-CM

## 2023-01-12 DIAGNOSIS — Z1159 Encounter for screening for other viral diseases: Secondary | ICD-10-CM | POA: Diagnosis not present

## 2023-01-12 DIAGNOSIS — Z0000 Encounter for general adult medical examination without abnormal findings: Secondary | ICD-10-CM

## 2023-01-12 DIAGNOSIS — I152 Hypertension secondary to endocrine disorders: Secondary | ICD-10-CM

## 2023-01-12 DIAGNOSIS — E669 Obesity, unspecified: Secondary | ICD-10-CM

## 2023-01-12 DIAGNOSIS — E1169 Type 2 diabetes mellitus with other specified complication: Secondary | ICD-10-CM

## 2023-01-12 DIAGNOSIS — Z7985 Long-term (current) use of injectable non-insulin antidiabetic drugs: Secondary | ICD-10-CM

## 2023-01-12 DIAGNOSIS — I10 Essential (primary) hypertension: Secondary | ICD-10-CM

## 2023-01-12 LAB — MICROALBUMIN / CREATININE URINE RATIO
Creatinine,U: 190.4 mg/dL
Microalb Creat Ratio: 0.9 mg/g (ref 0.0–30.0)
Microalb, Ur: 1.7 mg/dL (ref 0.0–1.9)

## 2023-01-12 MED ORDER — HYDROCHLOROTHIAZIDE 12.5 MG PO TABS
12.5000 mg | ORAL_TABLET | Freq: Every morning | ORAL | 1 refills | Status: DC
Start: 2023-01-12 — End: 2023-04-12

## 2023-01-12 MED ORDER — AMLODIPINE BESYLATE 10 MG PO TABS
10.0000 mg | ORAL_TABLET | Freq: Every day | ORAL | 1 refills | Status: DC
Start: 2023-01-12 — End: 2023-07-09

## 2023-01-12 MED ORDER — TIRZEPATIDE 7.5 MG/0.5ML ~~LOC~~ SOAJ
7.5000 mg | SUBCUTANEOUS | 1 refills | Status: DC
Start: 2023-01-12 — End: 2023-07-13

## 2023-01-12 MED ORDER — AMLODIPINE BESYLATE 10 MG PO TABS
10.0000 mg | ORAL_TABLET | Freq: Every day | ORAL | 1 refills | Status: DC
Start: 2023-01-12 — End: 2023-01-12

## 2023-01-12 NOTE — Assessment & Plan Note (Signed)
chronic, stable Amlodipine 10mg  qam,  BP still a little high has changed diet, increased exercise, but weight has not changed continue to advise on lifestyle modifications adding HCTZ 12.5mg , advised on use & SE message if continued high readings f/u 3 months

## 2023-01-12 NOTE — Assessment & Plan Note (Addendum)
A1C 6.3 with HTN & morbid obesity Mounjaro 5mg  qweek, tolerating, reports arm rash is a little better, has not worsened, no other SE wt has gone down not up & pt discouraged as he has continued to work out, giving detailed examples of his workouts as well as eating healthier, advised on trying to cut total calories per day, eat same foods but slightly smaller portions increasing dose to 7.5mg , continue to remind pt of possible SE  checking microalbuminuria today f/u 3 months

## 2023-01-13 LAB — HEPATITIS C ANTIBODY: Hepatitis C Ab: NONREACTIVE

## 2023-01-25 ENCOUNTER — Encounter (INDEPENDENT_AMBULATORY_CARE_PROVIDER_SITE_OTHER): Payer: Self-pay

## 2023-03-01 LAB — HELIX MOLECULAR SCREEN: Genetic Analysis Overall Interpretation: NEGATIVE

## 2023-03-08 ENCOUNTER — Other Ambulatory Visit (HOSPITAL_COMMUNITY): Payer: 59 | Attending: Medical Genetics

## 2023-04-12 ENCOUNTER — Other Ambulatory Visit: Payer: Self-pay | Admitting: Family

## 2023-04-12 DIAGNOSIS — I10 Essential (primary) hypertension: Secondary | ICD-10-CM

## 2023-04-13 ENCOUNTER — Ambulatory Visit (INDEPENDENT_AMBULATORY_CARE_PROVIDER_SITE_OTHER): Payer: 59 | Admitting: Family

## 2023-04-13 ENCOUNTER — Encounter: Payer: Self-pay | Admitting: Family

## 2023-04-13 VITALS — BP 160/98 | HR 93 | Temp 98.0°F | Ht 69.0 in | Wt 296.0 lb

## 2023-04-13 DIAGNOSIS — Z6841 Body Mass Index (BMI) 40.0 and over, adult: Secondary | ICD-10-CM | POA: Diagnosis not present

## 2023-04-13 DIAGNOSIS — E1169 Type 2 diabetes mellitus with other specified complication: Secondary | ICD-10-CM

## 2023-04-13 DIAGNOSIS — E1159 Type 2 diabetes mellitus with other circulatory complications: Secondary | ICD-10-CM | POA: Diagnosis not present

## 2023-04-13 DIAGNOSIS — E669 Obesity, unspecified: Secondary | ICD-10-CM

## 2023-04-13 DIAGNOSIS — I152 Hypertension secondary to endocrine disorders: Secondary | ICD-10-CM

## 2023-04-13 DIAGNOSIS — E119 Type 2 diabetes mellitus without complications: Secondary | ICD-10-CM

## 2023-04-13 DIAGNOSIS — I10 Essential (primary) hypertension: Secondary | ICD-10-CM | POA: Diagnosis not present

## 2023-04-13 DIAGNOSIS — Z7985 Long-term (current) use of injectable non-insulin antidiabetic drugs: Secondary | ICD-10-CM

## 2023-04-13 LAB — LIPID PANEL
Cholesterol: 131 mg/dL (ref 0–200)
HDL: 39.8 mg/dL (ref >39.00)
LDL Cholesterol: 79 mg/dL (ref 0–99)
NonHDL: 91.24
Total CHOL/HDL Ratio: 3
Triglycerides: 60 mg/dL (ref 0.0–149.0)
VLDL: 12 mg/dL (ref 0.0–40.0)

## 2023-04-13 LAB — BASIC METABOLIC PANEL
BUN: 15 mg/dL (ref 6–23)
CO2: 35 meq/L — ABNORMAL HIGH (ref 19–32)
Calcium: 9.3 mg/dL (ref 8.4–10.5)
Chloride: 99 meq/L (ref 96–112)
Creatinine, Ser: 0.99 mg/dL (ref 0.40–1.50)
GFR: 94.38 mL/min (ref >60.00)
Glucose, Bld: 100 mg/dL — ABNORMAL HIGH (ref 70–99)
Potassium: 3.3 meq/L — ABNORMAL LOW (ref 3.5–5.1)
Sodium: 141 meq/L (ref 135–145)

## 2023-04-13 MED ORDER — OLMESARTAN MEDOXOMIL-HCTZ 20-12.5 MG PO TABS
1.0000 | ORAL_TABLET | Freq: Every day | ORAL | 1 refills | Status: AC
Start: 2023-04-13 — End: ?

## 2023-04-13 NOTE — Assessment & Plan Note (Signed)
Patient has lost 16 pounds since last visit, currently at 296 pounds. Patient is on Mounjaro 7.5mg  and has been working with a dietician and physiotherapy group. Patient reports regular gym attendance and dietary changes. -Continue current weight loss strategies. -Continue Mounjaro 7.5mg  weekly. No refill today. -Follow-up in 3 months

## 2023-04-13 NOTE — Progress Notes (Signed)
Patient ID: Travis Jacobson, male    DOB: 10-08-1980, 42 y.o.   MRN: [id number]  Chief Complaint  Patient presents with   Hypertension   Diabetes   Discussed the use of AI scribe software for clinical note transcription with the patient, who gave verbal consent to proceed.  History of Present Illness   The patient, with a history of hypertension, T2DM and obesity, presents for a follow-up visit. He reports a successful weight loss journey, having lost 16 pounds since the last visit. He attributes this success to a combination of medication (Mounjaro 7.5mg ), dietary changes, and regular exercise. He reports no adverse effects from the weight loss regimen, except for some skin reactions which have since resolved with the use of steroids. The patient's hypertension, however, remains a concern. Despite taking his Amlodipine & HCTZ  his blood pressure readings remain high. He also mentions forgetting to take his medication on some days due to his early morning gym routine. He denies any symptoms of rapid heart rate, dizziness, or headaches.        Assessment & Plan:     Hypertension - Elevated blood pressure at today's visit. Patient is on Amlodipine 10mg  and Hydrochlorothiazide 12.5mg  daily. Patient reports inconsistent medication adherence due to early morning gym routine. -Advise patient to carry medications in gym bag for consistent dosing. No refills needed today. -Add Olmesartan 20mg -HCTZ 12.5mg , stop HCTZ at home. -Order lipid panel & BMP today. -Follow-up in 3 months   T2DM w/Obesity - Patient has lost 16 pounds since last visit, currently at 296 pounds. Patient is on Mounjaro 7.5mg  and has been working with a dietician and physiotherapy group. Patient reports regular gym attendance and dietary changes. -Continue current weight loss strategies. -Continue Mounjaro 7.5mg  weekly. No refill today. -Follow-up in 3 months     Subjective:    Outpatient Medications Prior to Visit   Medication Sig Dispense Refill   amLODipine (NORVASC) 10 MG tablet Take 1 tablet (10 mg total) by mouth daily. 90 tablet 1   hydrochlorothiazide (HYDRODIURIL) 12.5 MG tablet TAKE 1 TABLET(12.5 MG) BY MOUTH IN THE MORNING FOR BLOOD PRESSURE 90 tablet 1   tirzepatide (MOUNJARO) 7.5 MG/0.5ML Pen Inject 7.5 mg into the skin once a week. 6 mL 1   triamcinolone cream (KENALOG) 0.1 % Apply 1 Application topically 2 (two) times daily. Mix 1 pea size drop with pea size drop of body lotion. Apply to rash patches. 30 g 2   No facility-administered medications prior to visit.   Past Medical History:  Diagnosis Date   Acute respiratory failure (HCC) 10/13/2022   Sepsis due to pneumonia (HCC) 10/13/2022   Past Surgical History:  Procedure Laterality Date   surgery on finger Left 2000   No Known Allergies    Objective:    Physical Exam Vitals and nursing note reviewed.  Constitutional:      General: He is not in acute distress.    Appearance: Normal appearance. He is obese.  HENT:     Head: Normocephalic.  Cardiovascular:     Rate and Rhythm: Normal rate and regular rhythm.  Pulmonary:     Effort: Pulmonary effort is normal.     Breath sounds: Normal breath sounds.  Musculoskeletal:        General: Normal range of motion.     Cervical back: Normal range of motion.  Skin:    General: Skin is warm and dry.  Neurological:     Mental Status: He is alert and  oriented to person, place, and time.  Psychiatric:        Mood and Affect: Mood normal.    BP (!) 160/103 (BP Location: Left Arm, Patient Position: Sitting, Cuff Size: Large)   Pulse 93   Temp 98 F (36.7 C) (Temporal)   Ht 5\' 9"  (1.753 m)   Wt 296 lb (134.3 kg)   SpO2 98%   BMI 43.71 kg/m  Wt Readings from Last 3 Encounters:  04/13/23 296 lb (134.3 kg)  01/12/23 (!) 312 lb 6 oz (141.7 kg)  12/07/22 (!) 307 lb 9.6 oz (139.5 kg)      Dulce Sellar, NP

## 2023-04-13 NOTE — Assessment & Plan Note (Signed)
Elevated blood pressure at today's visit. Patient is on Amlodipine 10mg  and Hydrochlorothiazide 12.5mg  daily. Patient reports inconsistent medication adherence due to early morning gym routine. -Advise patient to carry medications in gym bag for consistent dosing. No refills needed today. -Add Olmesartan 20mg -HCTZ 12.5mg , stop HCTZ at home. -Order lipid panel & BMP today. -Follow-up in 3 months

## 2023-04-16 ENCOUNTER — Telehealth: Payer: Self-pay | Admitting: Family

## 2023-04-16 NOTE — Telephone Encounter (Signed)
Pt sent request to have diabetic foot exam and eye exam due to Mychart informing him this health maintenance is overdue. Can diabetic foot exam be done in office? Please Advise.

## 2023-04-17 NOTE — Telephone Encounter (Signed)
I have removed both due to his A1C in borderline range - if A1C goes back up then we will readdress, thanks.

## 2023-04-18 ENCOUNTER — Telehealth: Payer: Self-pay | Admitting: Family

## 2023-04-18 ENCOUNTER — Other Ambulatory Visit: Payer: Self-pay | Admitting: Family

## 2023-04-18 DIAGNOSIS — E876 Hypokalemia: Secondary | ICD-10-CM

## 2023-04-18 NOTE — Telephone Encounter (Signed)
lab ordered

## 2023-04-18 NOTE — Telephone Encounter (Signed)
Pt has been notified of message below. Pt verbalized understanding.

## 2023-04-18 NOTE — Telephone Encounter (Signed)
Pt has been scheduled lab visit for two week re-check of labs from 12/6. Please place necessary orders.

## 2023-05-03 ENCOUNTER — Ambulatory Visit: Payer: 59 | Admitting: Family

## 2023-05-03 ENCOUNTER — Other Ambulatory Visit (INDEPENDENT_AMBULATORY_CARE_PROVIDER_SITE_OTHER): Payer: 59

## 2023-05-03 DIAGNOSIS — E876 Hypokalemia: Secondary | ICD-10-CM | POA: Diagnosis not present

## 2023-05-03 LAB — BASIC METABOLIC PANEL
BUN: 14 mg/dL (ref 6–23)
CO2: 28 meq/L (ref 19–32)
Calcium: 9.1 mg/dL (ref 8.4–10.5)
Chloride: 104 meq/L (ref 96–112)
Creatinine, Ser: 0.89 mg/dL (ref 0.40–1.50)
GFR: 106.14 mL/min (ref >60.00)
Glucose, Bld: 100 mg/dL — ABNORMAL HIGH (ref 70–99)
Potassium: 3.6 meq/L (ref 3.5–5.1)
Sodium: 138 meq/L (ref 135–145)

## 2023-07-07 ENCOUNTER — Other Ambulatory Visit: Payer: Self-pay | Admitting: Family

## 2023-07-07 DIAGNOSIS — I10 Essential (primary) hypertension: Secondary | ICD-10-CM

## 2023-07-13 ENCOUNTER — Ambulatory Visit (INDEPENDENT_AMBULATORY_CARE_PROVIDER_SITE_OTHER): Payer: 59 | Admitting: Family

## 2023-07-13 ENCOUNTER — Encounter: Payer: Self-pay | Admitting: Family

## 2023-07-13 VITALS — BP 128/78 | HR 78 | Temp 98.9°F | Ht 69.0 in | Wt 283.2 lb

## 2023-07-13 DIAGNOSIS — E1169 Type 2 diabetes mellitus with other specified complication: Secondary | ICD-10-CM

## 2023-07-13 DIAGNOSIS — I152 Hypertension secondary to endocrine disorders: Secondary | ICD-10-CM

## 2023-07-13 DIAGNOSIS — Z7985 Long-term (current) use of injectable non-insulin antidiabetic drugs: Secondary | ICD-10-CM

## 2023-07-13 DIAGNOSIS — I10 Essential (primary) hypertension: Secondary | ICD-10-CM

## 2023-07-13 DIAGNOSIS — E669 Obesity, unspecified: Secondary | ICD-10-CM | POA: Diagnosis not present

## 2023-07-13 DIAGNOSIS — E1159 Type 2 diabetes mellitus with other circulatory complications: Secondary | ICD-10-CM

## 2023-07-13 DIAGNOSIS — Z6841 Body Mass Index (BMI) 40.0 and over, adult: Secondary | ICD-10-CM

## 2023-07-13 MED ORDER — TIRZEPATIDE 7.5 MG/0.5ML ~~LOC~~ SOAJ: 7.5000 mg | SUBCUTANEOUS | 1 refills | Status: DC

## 2023-07-13 NOTE — Progress Notes (Signed)
 Patient ID: Travis Jacobson, male    DOB: April 07, 1981, 43 y.o.   MRN: [id number]  Chief Complaint  Patient presents with   Hypertension    Pt states he has been recording his bp this is before taking meds 146/85. Yesterday 125/73 night time 127/67. States he is doing well changed eating habits drinks a lot of water.       Discussed the use of AI scribe software for clinical note transcription with the patient, who gave verbal consent to proceed.  History of Present Illness   The patient, with a history of hypertension, presents for a routine follow-up for weight loss. He has been actively losing weight and working out regularly. The patient reports significant improvement in his health and is excited about his progress. He has been adhering to a regular workout routine and has also made dietary changes. The patient's family has been supportive and has also started working out. The patient's blood pressure has been well-controlled with medication. The patient's wife, however, has been struggling to maintain a regular workout routine. The patient has been encouraging her to find a routine that works for her and to consider joining workout classes.       Assessment & Plan:     Hypertension  - Blood pressure well controlled on Amlodipine, Olmesartan-HCTZ regimen. Patient is monitoring blood pressure at home with consistent readings in the 120s/60s-70s. No symptoms of hypotension reported.   -Continue current antihypertensive medications, no refills needed today. -Advise patient to notify clinic if experiencing symptoms of hypotension (dizziness, lightheadedness).   -F/U 3 mos  Borderline DM/Obesity  - Patient has been successful in weight loss efforts, with family support and regular exercise. No concerns about rapid weight loss. Praised pt for efforts to date. -Continue current dose of Mounjaro 7.5mg  qweek, sending refill.  -Encourage continuation of regular exercise and healthy diet.   -F/U in 3  mos     Subjective:    Outpatient Medications Prior to Visit  Medication Sig Dispense Refill   amLODipine (NORVASC) 10 MG tablet TAKE 1 TABLET(10 MG) BY MOUTH DAILY 90 tablet 1   olmesartan-hydrochlorothiazide (BENICAR HCT) 20-12.5 MG tablet Take 1 tablet by mouth daily. 90 tablet 1   tirzepatide (MOUNJARO) 7.5 MG/0.5ML Pen Inject 7.5 mg into the skin once a week. 6 mL 1   triamcinolone cream (KENALOG) 0.1 % Apply 1 Application topically 2 (two) times daily. Mix 1 pea size drop with pea size drop of body lotion. Apply to rash patches. 30 g 2   No facility-administered medications prior to visit.   Past Medical History:  Diagnosis Date   Acute respiratory failure (HCC) 10/13/2022   Sepsis due to pneumonia (HCC) 10/13/2022   Past Surgical History:  Procedure Laterality Date   surgery on finger Left 2000   No Known Allergies    Objective:    Physical Exam Vitals and nursing note reviewed.  Constitutional:      General: He is not in acute distress.    Appearance: Normal appearance. He is obese.  HENT:     Head: Normocephalic.  Cardiovascular:     Rate and Rhythm: Normal rate and regular rhythm.  Pulmonary:     Effort: Pulmonary effort is normal.     Breath sounds: Normal breath sounds.  Musculoskeletal:        General: Normal range of motion.     Cervical back: Normal range of motion.  Skin:    General: Skin is warm and dry.  Neurological:     Mental Status: He is alert and oriented to person, place, and time.  Psychiatric:        Mood and Affect: Mood normal.    BP 128/78   Pulse 78   Temp 98.9 F (37.2 C) (Temporal)   Ht 5\' 9"  (1.753 m)   Wt 283 lb 3.2 oz (128.5 kg)   SpO2 98%   BMI 41.82 kg/m  Wt Readings from Last 3 Encounters:  07/13/23 283 lb 3.2 oz (128.5 kg)  04/13/23 296 lb (134.3 kg)  01/12/23 (!) 312 lb 6 oz (141.7 kg)      Dulce Sellar, NP

## 2023-07-13 NOTE — Assessment & Plan Note (Signed)
 Patient has been successful in weight loss efforts, with family support and regular exercise. No concerns about rapid weight loss. Praised pt for efforts to date. -Continue current dose of Mounjaro 7.5mg  qweek, sending refill.  -Encourage continuation of regular exercise and healthy diet.   -F/U in 3 mos

## 2023-07-13 NOTE — Assessment & Plan Note (Signed)
 Blood pressure well controlled on Amlodipine, Olmesartan-HCTZ regimen. Patient is monitoring blood pressure at home with consistent readings in the 120s/60s-70s. No symptoms of hypotension reported.   -Continue current antihypertensive medications, no refills needed today. -Advise patient to notify clinic if experiencing symptoms of hypotension (dizziness, lightheadedness).   -F/U 3 mos

## 2023-07-16 ENCOUNTER — Other Ambulatory Visit: Payer: Self-pay | Admitting: Family

## 2023-07-16 ENCOUNTER — Encounter: Payer: Self-pay | Admitting: Family

## 2023-07-16 ENCOUNTER — Other Ambulatory Visit: Payer: Self-pay

## 2023-07-16 DIAGNOSIS — E669 Obesity, unspecified: Secondary | ICD-10-CM

## 2023-10-19 ENCOUNTER — Ambulatory Visit (INDEPENDENT_AMBULATORY_CARE_PROVIDER_SITE_OTHER): Admitting: Family

## 2023-10-19 ENCOUNTER — Encounter: Payer: Self-pay | Admitting: Family

## 2023-10-19 VITALS — BP 139/84 | HR 79 | Temp 99.1°F | Ht 69.0 in | Wt 279.6 lb

## 2023-10-19 DIAGNOSIS — E669 Obesity, unspecified: Secondary | ICD-10-CM

## 2023-10-19 DIAGNOSIS — E1169 Type 2 diabetes mellitus with other specified complication: Secondary | ICD-10-CM | POA: Diagnosis not present

## 2023-10-19 DIAGNOSIS — I10 Essential (primary) hypertension: Secondary | ICD-10-CM | POA: Diagnosis not present

## 2023-10-19 DIAGNOSIS — E1159 Type 2 diabetes mellitus with other circulatory complications: Secondary | ICD-10-CM

## 2023-10-19 DIAGNOSIS — I152 Hypertension secondary to endocrine disorders: Secondary | ICD-10-CM | POA: Diagnosis not present

## 2023-10-19 LAB — POCT GLYCOSYLATED HEMOGLOBIN (HGB A1C): Hemoglobin A1C: 5.5 % (ref 4.0–5.6)

## 2023-10-19 MED ORDER — TIRZEPATIDE 10 MG/0.5ML ~~LOC~~ SOAJ: 10.0000 mg | SUBCUTANEOUS | 0 refills | Status: DC

## 2023-10-19 MED ORDER — OLMESARTAN MEDOXOMIL-HCTZ 20-12.5 MG PO TABS: 1.0000 | ORAL_TABLET | Freq: Every day | ORAL | 1 refills | Status: DC

## 2023-10-19 NOTE — Assessment & Plan Note (Signed)
 Hypertension managed with olmesartan -HCTZ 20-12.5mg  qam and amlodipine  10mg  qd.  - Send refill for olmesartan -HCTZ.  - Continue working on weight loss, exercise, water intake 2.5L daily and low sodium diet. - F/U in 3 mos

## 2023-10-19 NOTE — Assessment & Plan Note (Signed)
 Diabetes well-controlled with A1c of 5.5%. Continued Mounjaro  necessary to maintain glycemic control and continiue weight loss. Weight reduced from 312 lbs to 278 lbs. Body fat decreased to 41.2%. Target weight is 240 lbs. - Increase Mounjaro  to 10 mg qweek. - Monitor for adverse effects, especially skin reactions. - Continue current diet and exercise regimen. - Continue working with trainer and dietitian. - Discuss potential calorie intake reduction with dietitian. - F/U in 3 mos

## 2023-10-19 NOTE — Progress Notes (Signed)
 Patient ID: Travis Jacobson, male    DOB: 1980/11/17, 43 y.o.   MRN: [id number]  Chief Complaint  Patient presents with   Hypertension  Discussed the use of AI scribe software for clinical note transcription with the patient, who gave verbal consent to proceed.  History of Present Illness   Travis Jacobson is a 43 year old male with type 2 diabetes who presents for follow-up on diabetes management and weight loss.  His A1c is 5.5, and he has been on Mounjaro  7.5 mg for three months with minimal side effects, except for a minor skin patch. He has not used the prescribed cream for this issue. He administers Mounjaro  injections weekly and has a copay of $25 per month, with concerns about insurance coverage. He has reduced his weight from 312 pounds to 278 pounds and decreased his body fat percentage from 46-47% to 41.2%. He works with a Psychologist, educational focusing on Runner, broadcasting/film/video and follows a dietitian-prescribed plan with a net intake of 2500 calories per day, emphasizing salads and protein. He supplements with protein shakes and participates in a physiotherapy program covered for 20 sessions per year. He is on olmesartan  and amlodipine  for blood pressure management, with a recent refill of amlodipine .   Assessment and Plan    Type 2 Diabetes Mellitus w/Morbid Obesity - Diabetes well-controlled with A1c of 5.5%. Continued Mounjaro  necessary to maintain glycemic control and continiue weight loss. Weight reduced from 312 lbs to 278 lbs. Body fat decreased to 41.2%. Target weight is 240 lbs. - Increase Mounjaro  to 10 mg qweek. - Monitor for adverse effects, especially skin reactions. - Continue current diet and exercise regimen. - Continue working with trainer and dietitian. - Discuss potential calorie intake reduction with dietitian. - F/U in 3 mos  Hypertension Hypertension managed with olmesartan -HCTZ 20-12.5mg  qam and amlodipine  10mg  qd.  - Send refill for olmesartan -HCTZ.  - Continue working  on weight loss, exercise, water intake 2.5L daily and low sodium diet. - F/U in 3 mos     Subjective:    Outpatient Medications Prior to Visit  Medication Sig Dispense Refill   amLODipine  (NORVASC ) 10 MG tablet TAKE 1 TABLET(10 MG) BY MOUTH DAILY 90 tablet 1   olmesartan -hydrochlorothiazide  (BENICAR  HCT) 20-12.5 MG tablet Take 1 tablet by mouth daily. 90 tablet 1   tirzepatide  (MOUNJARO ) 7.5 MG/0.5ML Pen Inject 7.5 mg into the skin once a week. 6 mL 1   triamcinolone  cream (KENALOG ) 0.1 % Apply 1 Application topically 2 (two) times daily. Mix 1 pea size drop with pea size drop of body lotion. Apply to rash patches. 30 g 2   No facility-administered medications prior to visit.   Past Medical History:  Diagnosis Date   Acute respiratory failure (HCC) 10/13/2022   Sepsis due to pneumonia (HCC) 10/13/2022   Past Surgical History:  Procedure Laterality Date   surgery on finger Left 2000   No Known Allergies    Objective:    Physical Exam Vitals and nursing note reviewed.  Constitutional:      General: He is not in acute distress.    Appearance: Normal appearance. He is morbidly obese.  HENT:     Head: Normocephalic.   Cardiovascular:     Rate and Rhythm: Normal rate and regular rhythm.  Pulmonary:     Effort: Pulmonary effort is normal.     Breath sounds: Normal breath sounds.   Musculoskeletal:        General: Normal range of motion.  Cervical back: Normal range of motion.   Skin:    General: Skin is warm and dry.   Neurological:     Mental Status: He is alert and oriented to person, place, and time.   Psychiatric:        Mood and Affect: Mood normal.    BP 139/84   Pulse 79   Temp 99.1 F (37.3 C)   Ht 5' 9 (1.753 m)   Wt 279 lb 9.6 oz (126.8 kg)   SpO2 98%   BMI 41.29 kg/m  Wt Readings from Last 3 Encounters:  10/19/23 279 lb 9.6 oz (126.8 kg)  07/13/23 283 lb 3.2 oz (128.5 kg)  04/13/23 296 lb (134.3 kg)       Versa Gore, NP

## 2023-12-28 ENCOUNTER — Other Ambulatory Visit: Payer: Self-pay | Admitting: Family

## 2023-12-28 ENCOUNTER — Telehealth: Payer: Self-pay

## 2023-12-28 DIAGNOSIS — E1169 Type 2 diabetes mellitus with other specified complication: Secondary | ICD-10-CM

## 2023-12-28 MED ORDER — TIRZEPATIDE 10 MG/0.5ML ~~LOC~~ SOAJ: 10.0000 mg | SUBCUTANEOUS | 0 refills | Status: DC

## 2023-12-28 NOTE — Telephone Encounter (Signed)
 Please advise, Patient wanted to make sure to make sure it was okay to use these medications together, was prescribed those at urgent care for poison ivy, would like callback regarding this   Copied from CRM #[id number]. Topic: Clinical - Medication Question >> Dec 28, 2023 11:51 AM Travis Jacobson wrote: Reason for CRM: Patient called in regarding prescription   amLODipine  (NORVASC ) 10 MG tablet  olmesartan -hydrochlorothiazide  (BENICAR  HCT) 20-12.5 MG tablet    Prednisone 20MG   betamethasone dipropionate .05  Patient wanted to make sure to make sure it was okay to use these medications together, was prescribed those at urgent care for poison ivy, would like callback regarding this

## 2023-12-28 NOTE — Telephone Encounter (Unsigned)
 Copied from CRM [id number]. Topic: Clinical - Medication Refill >> Dec 28, 2023  4:35 PM Tiffini S wrote: Medication: tirzepatide  (MOUNJARO ) 10 MG/0.5ML Pen  Has the patient contacted their pharmacy? Yes (Agent: If no, request that the patient contact the pharmacy for the refill. If patient does not wish to contact the pharmacy document the reason why and proceed with request.) (Agent: If yes, when and what did the pharmacy advise?)  This is the patient's preferred pharmacy:  [facility] [phone] - [place], [place] - [address] AT [facility] [address] [place] [address] Phone: [phone] Fax: [phone]  Is this the correct pharmacy for this prescription? Yes If no, delete pharmacy and type the correct one.   Has the prescription been filled recently? Yes  Is the patient out of the medication? Yes, have two pens left   Has the patient been seen for an appointment in the last year OR does the patient have an upcoming appointment? Yes  Can we respond through MyChart? Yes  Agent: Please be advised that Rx refills may take up to 3 business days. We ask that you follow-up with your pharmacy.

## 2023-12-30 NOTE — Telephone Encounter (Signed)
 yes ok to take together

## 2023-12-31 NOTE — Telephone Encounter (Signed)
 I returned pt's call in regards, Pt verbalized understanding.

## 2024-01-16 ENCOUNTER — Encounter: Payer: 59 | Admitting: Family

## 2024-01-22 ENCOUNTER — Encounter: Admitting: Family

## 2024-02-05 ENCOUNTER — Other Ambulatory Visit: Payer: Self-pay | Admitting: Family

## 2024-02-05 DIAGNOSIS — I10 Essential (primary) hypertension: Secondary | ICD-10-CM

## 2024-03-28 ENCOUNTER — Other Ambulatory Visit: Payer: Self-pay | Admitting: Family

## 2024-03-28 DIAGNOSIS — I10 Essential (primary) hypertension: Secondary | ICD-10-CM

## 2024-05-12 ENCOUNTER — Other Ambulatory Visit (HOSPITAL_COMMUNITY): Payer: Self-pay

## 2024-05-12 ENCOUNTER — Ambulatory Visit: Admitting: Family

## 2024-05-12 VITALS — BP 136/98 | HR 80 | Resp 14 | Ht 69.0 in | Wt 293.6 lb

## 2024-05-12 DIAGNOSIS — R21 Rash and other nonspecific skin eruption: Secondary | ICD-10-CM

## 2024-05-12 DIAGNOSIS — E669 Obesity, unspecified: Secondary | ICD-10-CM | POA: Diagnosis not present

## 2024-05-12 DIAGNOSIS — Z6841 Body Mass Index (BMI) 40.0 and over, adult: Secondary | ICD-10-CM

## 2024-05-12 DIAGNOSIS — I10 Essential (primary) hypertension: Secondary | ICD-10-CM

## 2024-05-12 DIAGNOSIS — Z13220 Encounter for screening for lipoid disorders: Secondary | ICD-10-CM | POA: Diagnosis not present

## 2024-05-12 DIAGNOSIS — Z7985 Long-term (current) use of injectable non-insulin antidiabetic drugs: Secondary | ICD-10-CM

## 2024-05-12 DIAGNOSIS — E119 Type 2 diabetes mellitus without complications: Secondary | ICD-10-CM | POA: Diagnosis not present

## 2024-05-12 DIAGNOSIS — Z0000 Encounter for general adult medical examination without abnormal findings: Secondary | ICD-10-CM

## 2024-05-12 LAB — COMPREHENSIVE METABOLIC PANEL WITH GFR
ALT: 39 U/L (ref 3–53)
AST: 24 U/L (ref 5–37)
Albumin: 4.3 g/dL (ref 3.5–5.2)
Alkaline Phosphatase: 52 U/L (ref 39–117)
BUN: 17 mg/dL (ref 6–23)
CO2: 31 meq/L (ref 19–32)
Calcium: 8.9 mg/dL (ref 8.4–10.5)
Chloride: 102 meq/L (ref 96–112)
Creatinine, Ser: 0.9 mg/dL (ref 0.40–1.50)
GFR: 105.02 mL/min
Glucose, Bld: 98 mg/dL (ref 70–99)
Potassium: 3.6 meq/L (ref 3.5–5.1)
Sodium: 140 meq/L (ref 135–145)
Total Bilirubin: 0.3 mg/dL (ref 0.2–1.2)
Total Protein: 7.7 g/dL (ref 6.0–8.3)

## 2024-05-12 LAB — TSH: TSH: 1.17 u[IU]/mL (ref 0.35–5.50)

## 2024-05-12 LAB — LIPID PANEL
Cholesterol: 132 mg/dL (ref 28–200)
HDL: 50.2 mg/dL
LDL Cholesterol: 72 mg/dL (ref 10–99)
NonHDL: 82.05
Total CHOL/HDL Ratio: 3
Triglycerides: 50 mg/dL (ref 10.0–149.0)
VLDL: 10 mg/dL (ref 0.0–40.0)

## 2024-05-12 LAB — CBC WITH DIFFERENTIAL/PLATELET
Basophils Absolute: 0 K/uL (ref 0.0–0.1)
Basophils Relative: 0.7 % (ref 0.0–3.0)
Eosinophils Absolute: 0.1 K/uL (ref 0.0–0.7)
Eosinophils Relative: 2.2 % (ref 0.0–5.0)
HCT: 42.6 % (ref 39.0–52.0)
Hemoglobin: 13.8 g/dL (ref 13.0–17.0)
Lymphocytes Relative: 16.8 % (ref 12.0–46.0)
Lymphs Abs: 0.9 K/uL (ref 0.7–4.0)
MCHC: 32.4 g/dL (ref 30.0–36.0)
MCV: 76.8 fl — ABNORMAL LOW (ref 78.0–100.0)
Monocytes Absolute: 0.6 K/uL (ref 0.1–1.0)
Monocytes Relative: 11.4 % (ref 3.0–12.0)
Neutro Abs: 3.8 K/uL (ref 1.4–7.7)
Neutrophils Relative %: 68.9 % (ref 43.0–77.0)
Platelets: 234 K/uL (ref 150.0–400.0)
RBC: 5.54 Mil/uL (ref 4.22–5.81)
RDW: 13.7 % (ref 11.5–15.5)
WBC: 5.5 K/uL (ref 4.0–10.5)

## 2024-05-12 LAB — HEMOGLOBIN A1C: Hgb A1c MFr Bld: 5.8 % (ref 4.6–6.5)

## 2024-05-12 MED ORDER — AMLODIPINE BESYLATE 10 MG PO TABS: 10.0000 mg | ORAL_TABLET | Freq: Every morning | ORAL | 1 refills | Status: DC

## 2024-05-12 MED ORDER — TIRZEPATIDE 2.5 MG/0.5ML ~~LOC~~ SOAJ: 2.5000 mg | SUBCUTANEOUS | 0 refills | Status: AC

## 2024-05-12 MED ORDER — TRIAMCINOLONE ACETONIDE 0.1 % EX CREA: 1.0000 | TOPICAL_CREAM | Freq: Two times a day (BID) | CUTANEOUS | 0 refills | Status: AC

## 2024-05-12 MED ORDER — OLMESARTAN MEDOXOMIL-HCTZ 20-12.5 MG PO TABS: 1.0000 | ORAL_TABLET | Freq: Every day | ORAL | 1 refills | Status: AC

## 2024-05-12 NOTE — Patient Instructions (Addendum)
 It was very nice to see you today!  I will review your lab results via MyChart in a few days. I have sent in all of your refills. We will let you know when the Mounjaro  is approved. Schedule a 4 month follow up visit.  Happy new year!      PLEASE NOTE:  If you had any lab tests please let us  know if you have not heard back within a few days. You may see your results on MyChart before we have a chance to review them but we will give you a call once they are reviewed by us . If we ordered any referrals today, please let us  know if you have not heard from their office within the next week.

## 2024-05-12 NOTE — Progress Notes (Signed)
 " Phone: [phone]  Subjective:  Patient 44 y.o. male presenting for annual physical.  Chief Complaint  Patient presents with   Annual Exam    Wants to discuss medications and refills.    Diabetes   Hypertension  Discussed the use of AI scribe software for clinical note transcription with the patient, who gave verbal consent to proceed.  History of Present Illness Travis Jacobson is a 44 year old male with hypertension who presents for an annual physical and medication management.  He had a lapse in medication use after a job change led to temporary loss of insurance. During this time he could not afford Mounjaro  but continued olmesartan -hctz and amlodipine  until late 2025. He restarted amlodipine  in December 2025 when new insurance began. After stopping Mounjaro  10mg  his weight increased from 260 to 278 pounds, which he links to increased appetite, less effective home workouts, and more fast food. He has since canceled frequent fast food delivery. He last used Mounjaro  in August 2025 and has been off it since, with return of late-night snacking and larger portions. He continues to exercise at home with a bike and weights and plans to join a more affordable gym. He reports increased water intake and avoidance of soda, and uses meal delivery services to help prepare healthier meals.  Assessment & Plan Obesity, diabetes and weight management Weight gain due to Mounjaro  discontinuation and lack of gym access. Previous weight loss achieved with Mounjaro . Discussed Mounjaro  side effects and need for gradual weaning. Prior authorization required for insurance. - Restart Mounjaro  at 2.5 mg for four weeks, then increase to 5 mg if tolerated. - Ordered A1c test to assess current diabetes control. - Encouraged continuation of home workouts and dietary modifications. - Advised to contact provider after 30 days to report if any side effects w/Mounjaro  and progress. - F/U in 3 mos  Essential  hypertension Hypertension management disrupted by insurance issues. Olmesartan  unavailable since October/November; amlodipine  refilled recently. Blood pressure control critical due to weight gain and diabetes. - Sent refills for olmesartan -hctz 20-12.5mg  qam  and amlodipine  10mg  for 90 days. - F/U in 3 mos  General health maintenance Discussed oral hygiene, water intake, and dietary habits. Encouraged healthy eating and physical activity. - Order full CPE labs today. - Continue brushing twice daily and flossing. - Maintain water intake at 80-96 ounces per day. - Continue healthy eating habits and physical activity.  See problem oriented charting- ROS- full  review of systems was completed and negative except for: T2DM, HTN noted in HPI above.  The following were reviewed and entered/updated in epic: Past Medical History:  Diagnosis Date   Acute respiratory failure (HCC) 10/13/2022   Sepsis due to pneumonia (HCC) 10/13/2022   Patient Active Problem List   Diagnosis Date Noted   Obesity, diabetes, and hypertension syndrome (HCC) 11/10/2022   Hepatic steatosis 11/03/2022   Essential (primary) hypertension 11/02/2022   Obesity, Class III, BMI 40-49.9 (morbid obesity) (HCC) 10/13/2022   Past Surgical History:  Procedure Laterality Date   surgery on finger Left 2000    Family History  Problem Relation Age of Onset   Hypertension Mother    Healthy Father     Medications- reviewed and updated Current Outpatient Medications  Medication Sig Dispense Refill   tirzepatide  (MOUNJARO ) 2.5 MG/0.5ML Pen Inject 2.5 mg into the skin once a week. 2 mL 0   amLODipine  (NORVASC ) 10 MG tablet Take 1 tablet (10 mg total) by mouth in the morning. 90 tablet  1   olmesartan -hydrochlorothiazide  (BENICAR  HCT) 20-12.5 MG tablet Take 1 tablet by mouth daily. 90 tablet 1   triamcinolone  cream (KENALOG ) 0.1 % Apply 1 Application topically 2 (two) times daily. Mix 1 pea size drop with pea size drop of body  lotion. Apply to rash patches. 454 g 0   No current facility-administered medications for this visit.   Allergies-reviewed and updated No Known Allergies  Social History   Social History Narrative   Not on file    Objective:  BP (!) 136/98   Pulse 80   Resp 14   Ht 5' 9 (1.753 m)   Wt 293 lb 9.6 oz (133.2 kg)   SpO2 95%   BMI 43.36 kg/m  Physical Exam Vitals and nursing note reviewed.  Constitutional:      General: He is not in acute distress.    Appearance: Normal appearance. He is morbidly obese.  HENT:     Head: Normocephalic.     Right Ear: Tympanic membrane and external ear normal.     Left Ear: Tympanic membrane and external ear normal.     Nose: Nose normal.     Mouth/Throat:     Mouth: Mucous membranes are moist.  Eyes:     Extraocular Movements: Extraocular movements intact.  Cardiovascular:     Rate and Rhythm: Normal rate and regular rhythm.  Pulmonary:     Effort: Pulmonary effort is normal.     Breath sounds: Normal breath sounds.  Abdominal:     General: Abdomen is flat. There is no distension.     Palpations: Abdomen is soft.     Tenderness: There is no abdominal tenderness.  Musculoskeletal:        General: Normal range of motion.     Cervical back: Normal range of motion.  Skin:    General: Skin is warm and dry.  Neurological:     Mental Status: He is alert and oriented to person, place, and time.  Psychiatric:        Mood and Affect: Mood normal.        Behavior: Behavior normal.        Judgment: Judgment normal.      Assessment and Plan   Health Maintenance counseling: 1. Anticipatory guidance: Patient counseled regarding regular dental exams q6 months, eye exams yearly, avoiding smoking and second hand smoke, limiting alcohol to 2 beverages per day.   2. Risk factor reduction:  Advised patient of need for regular exercise and diet rich in fruits and vegetables to reduce risk of heart attack and stroke.    Wt Readings from Last 3  Encounters:  05/12/24 293 lb 9.6 oz (133.2 kg)  10/19/23 279 lb 9.6 oz (126.8 kg)  07/13/23 283 lb 3.2 oz (128.5 kg)   3. Immunizations/screenings/ancillary studies Immunization History  Administered Date(s) Administered   Moderna Sars-Covid-2 Vaccination 11/08/2019   Pneumococcal-Unspecified 10/14/2022   Tdap 11/02/2022   Health Maintenance Due  Topic Date Due   Diabetic kidney evaluation - eGFR measurement  05/02/2024   HEMOGLOBIN A1C  04/19/2024    4. Skin cancer screening-  advised regular sunscreen use. Denies worrisome, changing, or new skin lesions.  5. Smoking associated screening: non- smoker  6. STD screening - N/A 7. Alcohol screening: none 8. Exercise - working out at home, gym with weights and some cardio.  Recommended follow up: Return in about 3 months (around 08/10/2024) for diabetes, HTN, med refills. No future appointments.  Lab/Order associations:  fasting  Lucius Krabbe, NP    "

## 2024-05-14 ENCOUNTER — Ambulatory Visit: Payer: Self-pay | Admitting: Family

## 2024-05-20 ENCOUNTER — Telehealth: Payer: Self-pay

## 2024-05-20 ENCOUNTER — Other Ambulatory Visit (HOSPITAL_COMMUNITY): Payer: Self-pay

## 2024-05-20 NOTE — Telephone Encounter (Signed)
 Pharmacy Patient Advocate Encounter   Received notification from Pt Calls Messages that prior authorization for Mounjaro  2.5mg /0.92ml is required/requested.   Insurance verification completed.   The patient is insured through [employer].   Per test claim: PA required; PA submitted to above mentioned insurance via Latent Key/confirmation #/EOC [id number] Status is pending

## 2024-05-20 NOTE — Telephone Encounter (Signed)
 Pharmacy Patient Advocate Encounter  Received notification from [employer] that Prior Authorization for Mounjaro  2.5mg /0.59ml has been APPROVED from 04/20/24 to 05/20/25   PA #/Case ID/Reference #: [id number]

## 2024-05-21 NOTE — Telephone Encounter (Signed)
 Patient notified of PA approval via MyChart

## 2024-06-13 ENCOUNTER — Telehealth: Payer: Self-pay

## 2024-06-13 ENCOUNTER — Other Ambulatory Visit: Payer: Self-pay

## 2024-06-13 DIAGNOSIS — I10 Essential (primary) hypertension: Secondary | ICD-10-CM

## 2024-06-13 MED ORDER — AMLODIPINE BESYLATE 10 MG PO TABS: 10.0000 mg | ORAL_TABLET | Freq: Every morning | ORAL | 1 refills | Status: AC

## 2024-06-13 NOTE — Telephone Encounter (Signed)
 Rx sent.

## 2024-06-13 NOTE — Telephone Encounter (Signed)
 Copied from CRM #[id number]. Topic: Clinical - Prescription Issue >> Jun 13, 2024  9:41 AM Winona R wrote: Pt would like to have his amLODipine  (NORVASC ) [facility] that [facility] can get the 90 day supply

## 2024-08-07 ENCOUNTER — Ambulatory Visit: Admitting: Family
